# Patient Record
Sex: Female | Born: 1960
Health system: Southern US, Community
[De-identification: ages and names within clinical notes are randomized; demographics above are authoritative.]

## PROBLEM LIST (undated history)

## (undated) DIAGNOSIS — R102 Pelvic and perineal pain: Secondary | ICD-10-CM

## (undated) DIAGNOSIS — E785 Hyperlipidemia, unspecified: Secondary | ICD-10-CM

## (undated) DIAGNOSIS — Z9889 Other specified postprocedural states: Secondary | ICD-10-CM

## (undated) DIAGNOSIS — K219 Gastro-esophageal reflux disease without esophagitis: Secondary | ICD-10-CM

## (undated) DIAGNOSIS — L600 Ingrowing nail: Secondary | ICD-10-CM

## (undated) DIAGNOSIS — F419 Anxiety disorder, unspecified: Secondary | ICD-10-CM

## (undated) DIAGNOSIS — R7303 Prediabetes: Secondary | ICD-10-CM

## (undated) HISTORY — DX: Gastro-esophageal reflux disease without esophagitis: K21.9

## (undated) HISTORY — DX: Prediabetes: R73.03

## (undated) HISTORY — DX: Other specified postprocedural states: Z98.890

## (undated) HISTORY — PX: FOOT SURGERY: SHX648

## (undated) HISTORY — DX: Hyperlipidemia, unspecified: E78.5

## (undated) HISTORY — PX: ABDOMINAL HYSTERECTOMY: SHX81

## (undated) HISTORY — DX: Anxiety disorder, unspecified: F41.9

## (undated) HISTORY — DX: Pelvic and perineal pain: R10.2

## (undated) HISTORY — DX: Ingrowing nail: L60.0

---

## 2005-10-09 ENCOUNTER — Ambulatory Visit: Payer: Self-pay | Admitting: Obstetrics & Gynecology

## 2008-01-08 ENCOUNTER — Ambulatory Visit: Payer: Self-pay | Admitting: Obstetrics & Gynecology

## 2009-02-25 ENCOUNTER — Ambulatory Visit: Payer: Self-pay | Admitting: Obstetrics & Gynecology

## 2009-03-04 ENCOUNTER — Ambulatory Visit: Payer: Self-pay | Admitting: Obstetrics & Gynecology

## 2009-05-17 ENCOUNTER — Ambulatory Visit: Payer: Self-pay | Admitting: Unknown Physician Specialty

## 2009-05-18 ENCOUNTER — Ambulatory Visit: Payer: Self-pay | Admitting: Unknown Physician Specialty

## 2009-05-28 ENCOUNTER — Ambulatory Visit: Payer: Self-pay | Admitting: Unknown Physician Specialty

## 2010-06-06 ENCOUNTER — Ambulatory Visit: Payer: Self-pay | Admitting: Obstetrics & Gynecology

## 2014-08-03 ENCOUNTER — Ambulatory Visit: Payer: Self-pay | Admitting: Gastroenterology

## 2015-09-21 ENCOUNTER — Ambulatory Visit (INDEPENDENT_AMBULATORY_CARE_PROVIDER_SITE_OTHER): Payer: BLUE CROSS/BLUE SHIELD | Admitting: Podiatry

## 2015-09-21 ENCOUNTER — Encounter: Payer: Self-pay | Admitting: Podiatry

## 2015-09-21 VITALS — BP 134/87 | HR 73 | Resp 18

## 2015-09-21 DIAGNOSIS — L600 Ingrowing nail: Secondary | ICD-10-CM

## 2015-09-21 NOTE — Progress Notes (Signed)
   Subjective:    Patient ID: Sharon Carney, female    DOB: Jul 25, 1960, 55 y.o.   MRN: [id number]  HPI  55 year old female presents the also concerns of left big toe swelling or redness which is been ongoing for the last week or so. She has noticed the redness around the toenail the toenail has been somewhat sore along the nail border. She denies any drainage or pus. Denies any red streaks. She said no previous treatment. No recent injury or trauma. No other complaints.  Review of Systems  All other systems reviewed and are negative.      Objective:   Physical Exam General: AAO x3, NAD  Dermatological: There appears to be incurvation on the medial nail border left hallux toenail tenderness palpation overlying this area. There is localized edema and erythema along the nail border. There is no other areas of edema or erythema. No open lesions or pre-ulcer lesions identified.  Vascular: Dorsalis Pedis artery and Posterior Tibial artery pedal pulses are 2/4 bilateral with immedate capillary fill time. Pedal hair growth present. No varicosities and no lower extremity edema present bilateral. There is no pain with calf compression, swelling, warmth, erythema.   Neruologic: Grossly intact via light touch bilateral. Vibratory intact via tuning fork bilateral. Protective threshold with Semmes Wienstein monofilament intact to all pedal sites bilateral. Patellar and Achilles deep tendon reflexes 2+ bilateral. No Babinski or clonus noted bilateral.   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 There's tenderness on the medial nail border of the left hallux toenail. No other areas of tenderness to bilateral lower extremities.   Gait: Unassisted, Nonantalgic.      Assessment & Plan:  55 year old female left hallux medial nail border ingrown toenail -Treatment options discussed including all alternatives, risks, and complications -Etiology of symptoms were discussed -At this  time, the patient is requesting partial nail removal with chemical matricectomy to the symptomatic portion of the nail. Risks and complications were discussed with the patient for which they understand and  verbally consent to the procedure. Under sterile conditions a total of 3 mL of a mixture of 2% lidocaine plain and 0.5% Marcaine plain was infiltrated in a hallux block fashion. Once anesthetized, the skin was prepped in sterile fashion. A tourniquet was then applied. Next the medial aspect of hallux nail border was then sharply excised making sure to remove the entire offending nail border. Once the nails were ensured to be removed area was debrided and the underlying skin was intact. There is no purulence identified in the procedure. Next phenol was then applied under standard conditions and copiously irrigated. Silvadene was applied. A dry sterile dressing was applied. After application of the dressing the tourniquet was removed and there is found to be an immediate capillary refill time to the digit. The patient tolerated the procedure well any complications. Post procedure instructions were discussed the patient for which he verbally understood. Follow-up in one week for nail check or sooner if any problems are to arise. Discussed signs/symptoms of infection and directed to call the office immediately should any occur or go directly to the emergency room. In the meantime, encouraged to call the office with any questions, concerns, changes symptoms.  Ovid CurdMatthew Wagoner, DPM

## 2015-09-21 NOTE — Patient Instructions (Signed)

## 2015-09-28 ENCOUNTER — Ambulatory Visit (INDEPENDENT_AMBULATORY_CARE_PROVIDER_SITE_OTHER): Payer: BLUE CROSS/BLUE SHIELD | Admitting: Podiatry

## 2015-09-28 ENCOUNTER — Encounter: Payer: Self-pay | Admitting: Podiatry

## 2015-09-28 DIAGNOSIS — Z9889 Other specified postprocedural states: Secondary | ICD-10-CM | POA: Insufficient documentation

## 2015-09-28 DIAGNOSIS — L600 Ingrowing nail: Secondary | ICD-10-CM

## 2015-09-28 HISTORY — DX: Other specified postprocedural states: Z98.890

## 2015-09-28 HISTORY — DX: Ingrowing nail: L60.0

## 2015-09-28 NOTE — Progress Notes (Signed)
Patient ID: Sharon Carney Corradi, female   DOB: 1961/05/03, 55 y.o.   MRN: [id number]  Subjective: Sharon Carney Weigelt is a 55 y.o.  female returns to office today for follow up evaluation after having left medial Hallux permanent partial nail avulsion performed. Patient has been soaking using epsom satls and applying topical antibiotic covered with bandaid daily. She has occasional pain when walking. No drainage or pus. Patient denies fevers, chills, nausea, vomiting. Denies any calf pain, chest pain, SOB.   Objective:  Vitals: Reviewed  General: Well developed, nourished, in no acute distress, alert and oriented x3   Dermatology: Skin is warm, dry and supple bilateral. Left hallux nail border appears to be clean, dry, with mild granular tissue and surrounding scab. There is no surrounding erythema, edema, drainage/purulence. The remaining nails appear unremarkable at this time. There are no other lesions or other signs of infection present.  Neurovascular status: Intact. No lower extremity swelling; No pain with calf compression bilateral.  Musculoskeletal: Decreased tenderness to palpation of the medial hallux nail fold. Muscular strength within normal limits bilateral. HAV present.  Assesement and Plan: S/p partial nail avulsion, doing well.   -Continue soaking in epsom salts twice a day followed by antibiotic ointment and a band-aid. Can leave uncovered at night. Continue this until completely healed.  -If the area has not healed in 2 weeks, call the office for follow-up appointment, or sooner if any problems arise.  -Monitor for any signs/symptoms of infection. Call the office immediately if any occur or go directly to the emergency room. Call with any questions/concerns.  Ovid CurdMatthew Wagoner, DPM

## 2015-09-28 NOTE — Patient Instructions (Signed)

## 2016-01-06 DIAGNOSIS — Z01419 Encounter for gynecological examination (general) (routine) without abnormal findings: Secondary | ICD-10-CM | POA: Diagnosis not present

## 2016-01-06 DIAGNOSIS — N3281 Overactive bladder: Secondary | ICD-10-CM | POA: Diagnosis not present

## 2016-01-10 LAB — HM PAP SMEAR: HM PAP: NEGATIVE

## 2016-09-06 ENCOUNTER — Ambulatory Visit (INDEPENDENT_AMBULATORY_CARE_PROVIDER_SITE_OTHER): Payer: BLUE CROSS/BLUE SHIELD | Admitting: Podiatry

## 2016-09-06 ENCOUNTER — Encounter: Payer: Self-pay | Admitting: Podiatry

## 2016-09-06 DIAGNOSIS — M722 Plantar fascial fibromatosis: Secondary | ICD-10-CM | POA: Diagnosis not present

## 2016-09-06 DIAGNOSIS — B353 Tinea pedis: Secondary | ICD-10-CM | POA: Diagnosis not present

## 2016-09-06 MED ORDER — TRIAMCINOLONE ACETONIDE 0.1 % EX CREA
1.0000 | TOPICAL_CREAM | Freq: Two times a day (BID) | CUTANEOUS | 2 refills | Status: DC
Start: 2016-09-06 — End: 2019-04-21

## 2016-09-07 NOTE — Progress Notes (Signed)
She presents today with a chief complaint of a rash between the second and third toes of the left foot. She states that she's had athlete's foot before she's tried Naftin cream clips from is all I, as all powder and Lamisil cream to no avail. She states it is sore dry scaly plaque like and itches. She would also like a tear orthotics.  Objective: Vital signs are stable she is alert and oriented 3. Pulses are palpable. Neurologic sensorium is intact. Deep tendon reflexes are intact. Muscle strength +5 over 5 dorsiflexion plantar flexors and inverters everters all into the musculature is intact. Orthopedic evaluation and history is all joints distal to the ankle for range of motion without crepitus mild HAV deformities bilateral. Mild hammertoe deformity is flexible nature bilateral. Uncomplicated. Cutaneous evaluation does demonstrate soft tissue plaque over the proximal phalanx of the second digit. There is some dry scaly skin in this area but the plaque is hard appears to be more eczematous than tinea. Mild flexible foot deformity with tenderness on palpation medial calcaneal tubercles.  Assessment: Eczematous dermatitis. History of plantar fasciitis which appears to be recurring secondary to ulnar orthotics.  Plan: Start her on triamcinolone cream 0.1% applied twice daily for the first week to 2 weeks and then taper to once daily over the next week and then discontinue. Follow up with me if this does not resolve. She is candidate for nasal orthotics.

## 2016-10-09 ENCOUNTER — Ambulatory Visit (INDEPENDENT_AMBULATORY_CARE_PROVIDER_SITE_OTHER): Payer: BLUE CROSS/BLUE SHIELD | Admitting: Podiatry

## 2016-10-09 ENCOUNTER — Encounter: Payer: Self-pay | Admitting: Podiatry

## 2016-10-09 DIAGNOSIS — B353 Tinea pedis: Secondary | ICD-10-CM | POA: Diagnosis not present

## 2016-10-09 DIAGNOSIS — M722 Plantar fascial fibromatosis: Secondary | ICD-10-CM

## 2016-10-09 NOTE — Progress Notes (Signed)
She presents today to pick up her orthotics. She states that her toe is not itching like it was.  Objective: Vital signs are stable she is alert and oriented 3 she has dermatitis and second digit of the right foot. This appears to be much decrease in edema and erythema from previous visits.  Assessment: History of fasciitis capsulitis dermatitis.  Plan: A biopsy will be performed next visit if this does not resolve. Orthotics were never ordered and the patient states that she wants to cancel her order because she didn't know they were going across $1000.

## 2016-12-26 ENCOUNTER — Telehealth: Payer: Self-pay | Admitting: Obstetrics & Gynecology

## 2016-12-26 NOTE — Telephone Encounter (Signed)
Pt states breast in going across her breast down the sides and to her back. Went to exercise this morning and came back and rash appeared after her shower. Has not done anything different as in soaps or detergents. History of shingles but this rash is itchy and not painful. Per pt looks like a sunburn. Applied benadryl gel with little relief. Advised pt to take benadryl by mouth or use hydrocortisone cream OTC. If rash and itching persist advised to go to PCP or urgent care.

## 2016-12-26 NOTE — Telephone Encounter (Signed)
Pt is calling with a Rash going across her Breast. Would like to speak with a Nurse . Please advise no opening in schedule today

## 2016-12-29 DIAGNOSIS — B09 Unspecified viral infection characterized by skin and mucous membrane lesions: Secondary | ICD-10-CM | POA: Diagnosis not present

## 2017-04-01 ENCOUNTER — Other Ambulatory Visit: Payer: Self-pay | Admitting: Obstetrics & Gynecology

## 2017-04-30 ENCOUNTER — Encounter: Payer: Self-pay | Admitting: Obstetrics & Gynecology

## 2017-04-30 ENCOUNTER — Ambulatory Visit (INDEPENDENT_AMBULATORY_CARE_PROVIDER_SITE_OTHER): Payer: BLUE CROSS/BLUE SHIELD | Admitting: Obstetrics & Gynecology

## 2017-04-30 VITALS — BP 120/70 | HR 53 | Ht 64.0 in | Wt 160.0 lb

## 2017-04-30 DIAGNOSIS — Z1211 Encounter for screening for malignant neoplasm of colon: Secondary | ICD-10-CM | POA: Diagnosis not present

## 2017-04-30 DIAGNOSIS — Z13220 Encounter for screening for lipoid disorders: Secondary | ICD-10-CM | POA: Diagnosis not present

## 2017-04-30 DIAGNOSIS — Z1321 Encounter for screening for nutritional disorder: Secondary | ICD-10-CM

## 2017-04-30 DIAGNOSIS — Z1231 Encounter for screening mammogram for malignant neoplasm of breast: Secondary | ICD-10-CM | POA: Diagnosis not present

## 2017-04-30 DIAGNOSIS — Z1239 Encounter for other screening for malignant neoplasm of breast: Secondary | ICD-10-CM

## 2017-04-30 DIAGNOSIS — Z1329 Encounter for screening for other suspected endocrine disorder: Secondary | ICD-10-CM

## 2017-04-30 DIAGNOSIS — Z131 Encounter for screening for diabetes mellitus: Secondary | ICD-10-CM | POA: Diagnosis not present

## 2017-04-30 DIAGNOSIS — Z01419 Encounter for gynecological examination (general) (routine) without abnormal findings: Secondary | ICD-10-CM

## 2017-04-30 DIAGNOSIS — Z0000 Encounter for general adult medical examination without abnormal findings: Secondary | ICD-10-CM | POA: Insufficient documentation

## 2017-04-30 MED ORDER — TOLTERODINE TARTRATE ER 4 MG PO CP24: ORAL_CAPSULE | ORAL | 11 refills | Status: DC

## 2017-04-30 NOTE — Progress Notes (Signed)
HPI:      [name] is a 56 y.o. G2P2002 who LMP was in the past, she presents today for her annual examination.  The patient has no complaints today. The patient is not sexually active. Herlast pap: approximate date 2017 and was normal and last mammogram: approximate date 2016 and was normal.  The patient does perform self breast exams.  There is no notable family history of breast or ovarian cancer in her family. The patient is not taking hormone replacement therapy. Patient denies post-menopausal vaginal bleeding.   The patient has regular exercise: yes. The patient denies current symptoms of depression.  Meds for OAB help, has more sx's when misses doses.  Min dry mouth SE.  GYN Hx: Last Colonoscopy:1 year ago. Normal.  Last DEXA: never ago.    PMHx: History reviewed. No pertinent past medical history. Past Surgical History:  Procedure Laterality Date  . ABDOMINAL HYSTERECTOMY    . FOOT SURGERY     Family History  Problem Relation Age of Onset  . Lymphoma Father   . Skin cancer Brother    Social History   Tobacco Use  . Smoking status: Never Smoker  . Smokeless tobacco: Never Used  Substance Use Topics  . Alcohol use: No    Alcohol/week: 0.0 oz  . Drug use: No    Current Outpatient Medications:  .  tolterodine (DETROL LA) 4 MG 24 hr capsule, TAKE 1 CAPSULE BY ORAL ROUTE ONCE DAILY, Disp: 30 capsule, Rfl: 13 .  triamcinolone cream (KENALOG) 0.1 %, Apply 1 application topically 2 (two) times daily., Disp: 30 g, Rfl: 2 Allergies: Codeine  Review of Systems  Constitutional: Negative for chills, fever and malaise/fatigue.  HENT: Negative for congestion, sinus pain and sore throat.   Eyes: Negative for blurred vision and pain.  Respiratory: Negative for cough and wheezing.   Cardiovascular: Negative for chest pain and leg swelling.  Gastrointestinal: Negative for abdominal pain, constipation, diarrhea, heartburn, nausea and vomiting.  Genitourinary: Negative for  dysuria, frequency, hematuria and urgency.  Musculoskeletal: Negative for back pain, joint pain, myalgias and neck pain.  Skin: Negative for itching and rash.  Neurological: Negative for dizziness, tremors and weakness.  Endo/Heme/Allergies: Does not bruise/bleed easily.  Psychiatric/Behavioral: Negative for depression. The patient is not nervous/anxious and does not have insomnia.     Objective: BP 120/70   Pulse (!) 53   Ht 5\' 4"  (1.626 m)   Wt 160 lb (72.6 kg)   BMI 27.46 kg/m   Filed Weights   04/30/17 1328  Weight: 160 lb (72.6 kg)   Body mass index is 27.46 kg/m. Physical Exam  Constitutional: She is oriented to person, place, and time. She appears well-developed and well-nourished. No distress.  Genitourinary: Rectum normal and vagina normal. Pelvic exam was performed with patient supine. There is no rash or lesion on the right labia. There is no rash or lesion on the left labia. Vagina exhibits no lesion. No bleeding in the vagina. Right adnexum does not display mass and does not display tenderness. Left adnexum does not display mass and does not display tenderness.  Genitourinary Comments: Absent Uterus Absent cervix Vaginal cuff well healed  HENT:  Head: Normocephalic and atraumatic. Head is without laceration.  Right Ear: Hearing normal.  Left Ear: Hearing normal.  Nose: No epistaxis.  No foreign bodies.  Mouth/Throat: Uvula is midline, oropharynx is clear and moist and mucous membranes are normal.  Eyes: Pupils are equal, round, and reactive to light.  Neck: Normal range of motion. Neck supple. No thyromegaly present.  Cardiovascular: Normal rate and regular rhythm. Exam reveals no gallop and no friction rub.  No murmur heard. Pulmonary/Chest: Effort normal and breath sounds normal. No respiratory distress. She has no wheezes. Right breast exhibits no mass, no skin change and no tenderness. Left breast exhibits no mass, no skin change and no tenderness.  Abdominal:  Soft. Bowel sounds are normal. She exhibits no distension. There is no tenderness. There is no rebound.  Musculoskeletal: Normal range of motion.  Neurological: She is alert and oriented to person, place, and time. No cranial nerve deficit.  Skin: Skin is warm and dry.  Psychiatric: She has a normal mood and affect. Judgment normal.  Vitals reviewed.   Assessment: Annual Exam 1. Annual physical exam   2. Screening for breast cancer   3. Screen for colon cancer     Plan:            1.  Cervical Screening-  Pap smear schedule reviewed with patient  2. Breast screening- Exam annually and mammogram scheduled  3. Colonoscopy every 10 years, Hemoccult testing after age 56  4. Labs To return fasting at a later date  5. Counseling for hormonal therapy: none, no change in therapy today  6. Cont Detrol LAS as helps OAB sx's.     F/U  Return in about 1 year (around 04/30/2018) for Annual.  Annamarie MajorPaul Ares Cardozo, MD, Merlinda FrederickFACOG [facility], [facility] 04/30/2017  1:41 PM

## 2017-04-30 NOTE — Patient Instructions (Addendum)
PAP every three years Mammogram every year    Call [phone] to schedule at [place] Colonoscopy every 10 years Labs yearly 

## 2017-05-01 ENCOUNTER — Other Ambulatory Visit: Payer: BLUE CROSS/BLUE SHIELD

## 2017-05-01 DIAGNOSIS — Z1321 Encounter for screening for nutritional disorder: Secondary | ICD-10-CM

## 2017-05-01 DIAGNOSIS — Z131 Encounter for screening for diabetes mellitus: Secondary | ICD-10-CM

## 2017-05-01 DIAGNOSIS — Z13220 Encounter for screening for lipoid disorders: Secondary | ICD-10-CM | POA: Diagnosis not present

## 2017-05-01 DIAGNOSIS — Z1329 Encounter for screening for other suspected endocrine disorder: Secondary | ICD-10-CM | POA: Diagnosis not present

## 2017-05-02 LAB — LIPID PANEL
CHOLESTEROL TOTAL: 240 mg/dL — AB (ref 100–199)
Chol/HDL Ratio: 4.2 ratio (ref 0.0–4.4)
HDL: 57 mg/dL (ref >39)
LDL Calculated: 165 mg/dL — ABNORMAL HIGH (ref 0–99)
Triglycerides: 91 mg/dL (ref 0–149)
VLDL CHOLESTEROL CAL: 18 mg/dL (ref 5–40)

## 2017-05-02 LAB — HEMOGLOBIN A1C
ESTIMATED AVERAGE GLUCOSE: 111 mg/dL
HEMOGLOBIN A1C: 5.5 % (ref 4.8–5.6)

## 2017-05-02 LAB — TSH: TSH: 1.98 u[IU]/mL (ref 0.450–4.500)

## 2017-05-02 LAB — VITAMIN D 25 HYDROXY (VIT D DEFICIENCY, FRACTURES): VIT D 25 HYDROXY: 67.4 ng/mL (ref 30.0–100.0)

## 2017-05-04 DIAGNOSIS — Z1211 Encounter for screening for malignant neoplasm of colon: Secondary | ICD-10-CM | POA: Diagnosis not present

## 2017-05-07 LAB — FECAL OCCULT BLOOD, IMMUNOCHEMICAL: Fecal Occult Bld: NEGATIVE

## 2017-05-07 LAB — SPECIMEN STATUS REPORT

## 2017-05-25 ENCOUNTER — Ambulatory Visit
Admission: RE | Admit: 2017-05-25 | Discharge: 2017-05-25 | Disposition: A | Discharge status: Home / Self Care | Payer: BLUE CROSS/BLUE SHIELD | Source: Ambulatory Visit | Attending: Obstetrics & Gynecology | Admitting: Obstetrics & Gynecology

## 2017-05-25 ENCOUNTER — Encounter: Payer: Self-pay | Admitting: Radiology

## 2017-05-25 DIAGNOSIS — Z1239 Encounter for other screening for malignant neoplasm of breast: Secondary | ICD-10-CM

## 2017-05-25 DIAGNOSIS — Z1231 Encounter for screening mammogram for malignant neoplasm of breast: Secondary | ICD-10-CM | POA: Diagnosis not present

## 2017-05-29 ENCOUNTER — Other Ambulatory Visit: Payer: Self-pay | Admitting: *Deleted

## 2017-05-29 ENCOUNTER — Inpatient Hospital Stay
Admission: RE | Admit: 2017-05-29 | Discharge: 2017-05-29 | Disposition: A | Discharge status: Home / Self Care | Payer: Self-pay | Source: Ambulatory Visit | Attending: *Deleted | Admitting: *Deleted

## 2017-05-29 DIAGNOSIS — Z9289 Personal history of other medical treatment: Secondary | ICD-10-CM

## 2017-08-21 DIAGNOSIS — J324 Chronic pansinusitis: Secondary | ICD-10-CM | POA: Diagnosis not present

## 2017-08-21 DIAGNOSIS — J309 Allergic rhinitis, unspecified: Secondary | ICD-10-CM | POA: Diagnosis not present

## 2017-08-21 DIAGNOSIS — H6123 Impacted cerumen, bilateral: Secondary | ICD-10-CM | POA: Diagnosis not present

## 2017-08-21 DIAGNOSIS — Z0110 Encounter for examination of ears and hearing without abnormal findings: Secondary | ICD-10-CM | POA: Diagnosis not present

## 2017-08-21 DIAGNOSIS — H6980 Other specified disorders of Eustachian tube, unspecified ear: Secondary | ICD-10-CM | POA: Diagnosis not present

## 2017-08-21 DIAGNOSIS — R51 Headache: Secondary | ICD-10-CM | POA: Diagnosis not present

## 2017-08-23 DIAGNOSIS — J309 Allergic rhinitis, unspecified: Secondary | ICD-10-CM | POA: Diagnosis not present

## 2017-09-03 DIAGNOSIS — Z889 Allergy status to unspecified drugs, medicaments and biological substances status: Secondary | ICD-10-CM | POA: Diagnosis not present

## 2017-09-03 DIAGNOSIS — L500 Allergic urticaria: Secondary | ICD-10-CM | POA: Diagnosis not present

## 2017-10-06 DIAGNOSIS — H9209 Otalgia, unspecified ear: Secondary | ICD-10-CM | POA: Diagnosis not present

## 2017-10-06 DIAGNOSIS — J069 Acute upper respiratory infection, unspecified: Secondary | ICD-10-CM | POA: Diagnosis not present

## 2017-10-08 DIAGNOSIS — H6980 Other specified disorders of Eustachian tube, unspecified ear: Secondary | ICD-10-CM | POA: Diagnosis not present

## 2017-10-08 DIAGNOSIS — H66009 Acute suppurative otitis media without spontaneous rupture of ear drum, unspecified ear: Secondary | ICD-10-CM | POA: Diagnosis not present

## 2017-11-21 ENCOUNTER — Encounter: Payer: Self-pay | Admitting: Obstetrics & Gynecology

## 2017-11-21 ENCOUNTER — Ambulatory Visit (INDEPENDENT_AMBULATORY_CARE_PROVIDER_SITE_OTHER): Payer: BLUE CROSS/BLUE SHIELD | Admitting: Obstetrics & Gynecology

## 2017-11-21 VITALS — BP 120/80 | Ht 64.0 in | Wt 166.0 lb

## 2017-11-21 DIAGNOSIS — N390 Urinary tract infection, site not specified: Secondary | ICD-10-CM | POA: Insufficient documentation

## 2017-11-21 DIAGNOSIS — N3001 Acute cystitis with hematuria: Secondary | ICD-10-CM

## 2017-11-21 DIAGNOSIS — R350 Frequency of micturition: Secondary | ICD-10-CM

## 2017-11-21 LAB — POCT URINALYSIS DIPSTICK
BILIRUBIN UA: NEGATIVE
Blood, UA: POSITIVE
Glucose, UA: NEGATIVE
KETONES UA: NEGATIVE
NITRITE UA: NEGATIVE
PROTEIN UA: NEGATIVE
Spec Grav, UA: 1.02 (ref 1.010–1.025)
Urobilinogen, UA: 1 E.U./dL
pH, UA: 8 (ref 5.0–8.0)

## 2017-11-21 MED ORDER — SULFAMETHOXAZOLE-TRIMETHOPRIM 800-160 MG PO TABS: 1.0000 | ORAL_TABLET | Freq: Two times a day (BID) | ORAL | 0 refills | Status: DC

## 2017-11-21 NOTE — Patient Instructions (Signed)

## 2017-11-21 NOTE — Progress Notes (Signed)
ZOX:WRUEAVW, No Pcp Per Chief Complaint  Patient presents with  . Vaginal Pain  . Urinary Frequency   Current Issues:  Presents with 2 days of urinary frequency and vaginal pain Associated symptoms include:  lower abdominal pain  There is a previous history of of similar symptoms. Sexually active:  No   No concern for STI.  History reviewed. No pertinent past medical history. Past Surgical History:  Procedure Laterality Date  . ABDOMINAL HYSTERECTOMY    . FOOT SURGERY    The patient has a family history of no cancer or other related illnesses. She  reports that she has never smoked. She has never used smokeless tobacco. She reports that she does not drink alcohol or use drugs. Allergies  Allergen Reactions  . Clindamycin Hives  . Codeine    Prior to Admission medications   Medication Sig Start Date End Date Taking? Authorizing Provider  tolterodine (DETROL LA) 4 MG 24 hr capsule TAKE 1 CAPSULE BY ORAL ROUTE ONCE DAILY 04/30/17  Yes Nadara Mustard, MD  triamcinolone cream (KENALOG) 0.1 % Apply 1 application topically 2 (two) times daily. Patient not taking: Reported on 11/21/2017 09/06/16   Elinor Parkinson, DPM   Review of Systems:Recent hemorrhoid Review of Systems - General ROS: negative Psychological ROS: negative Allergy and Immunology ROS: recent URI Hematological and Lymphatic ROS: negative Endocrine ROS: negative Breast ROS: negative for breast lumps Respiratory ROS: no cough, shortness of breath, or wheezing Cardiovascular ROS: no chest pain or dyspnea on exertion Gastrointestinal ROS: no abdominal pain, change in bowel habits, or black or bloody stools recent hemorrhoid Genito-Urinary ROS: positive for - pelvic pain, urinary frequency/urgency and vulvar/vaginal symptoms Musculoskeletal ROS: negative Neurological ROS: no TIA or stroke symptoms Dermatological ROS: negative  PE:  BP 120/80   Ht  (1.626 m)   Wt 166 lb (75.3 kg)   BMI 28.49 kg/m  Constitutional  Gen NAD Heart Reg R/R, no m/g/r Lungs CTA B Back No CVAT Abdomen NT, ND, no mass Pelvic Absent uterus and cervix Vag atrophy No ext lesions No pelvic mass Rectal- very small hemorrhoid  Results for orders placed or performed in visit on 11/21/17  POCT urinalysis dipstick  Result Value Ref Range   Color, UA     Clarity, UA     Glucose, UA Negative Negative   Bilirubin, UA neg    Ketones, UA neg    Spec Grav, UA 1.020 1.010 - 1.025   Blood, UA positive    pH, UA 8.0 5.0 - 8.0   Protein, UA Negative Negative   Urobilinogen, UA 1.0 0.2 or 1.0 E.U./dL   Nitrite, UA neg    Leukocytes, UA Moderate (2+) (A) Negative   Appearance     Odor      Assessment and Plan:  1. Frequency of urination and UTI w Hematuria - POCT urinalysis dipstick - Treat w Bactrim  2. Hemorrhoid - Cont diet adjustments for IBS - OTC meds  Annamarie Major, MD, [facility], [facility] 11/21/2017  9:28 AM

## 2017-11-24 ENCOUNTER — Encounter: Payer: Self-pay | Admitting: Obstetrics & Gynecology

## 2017-11-26 ENCOUNTER — Encounter: Payer: Self-pay | Admitting: Obstetrics & Gynecology

## 2017-11-26 NOTE — Telephone Encounter (Signed)
Can you send in the previous rx?

## 2017-11-26 NOTE — Telephone Encounter (Signed)
Pt following up on previous email. Pt states she is still having trouble w/UTI. She was having problems with the medicine she was on. [phone]#[phone]

## 2017-11-26 NOTE — Telephone Encounter (Signed)
Not sure if you want her to come in for urine culture given continued symptoms and failure to improve despite finishing course of bactrim

## 2017-11-26 NOTE — Telephone Encounter (Signed)
[id number] said she needs more of the UTI meds [facility] gave her last week, she said she left a message this morning

## 2017-11-26 NOTE — Telephone Encounter (Signed)
Can you send in something different for her?

## 2017-11-27 ENCOUNTER — Other Ambulatory Visit: Payer: Self-pay | Admitting: Obstetrics & Gynecology

## 2017-11-27 MED ORDER — CIPROFLOXACIN HCL 500 MG PO TABS: 500.0000 mg | ORAL_TABLET | Freq: Two times a day (BID) | ORAL | 0 refills | Status: DC

## 2017-11-27 NOTE — Telephone Encounter (Signed)
Please see why this was delayed in response (phone message left on nurse line).

## 2017-11-27 NOTE — Telephone Encounter (Signed)
Pt aware and if symptoms not improving [facility] [place]

## 2017-11-29 ENCOUNTER — Encounter: Payer: Self-pay | Admitting: Obstetrics & Gynecology

## 2017-11-29 NOTE — Telephone Encounter (Signed)
[facility]

## 2017-11-30 ENCOUNTER — Encounter: Payer: Self-pay | Admitting: Obstetrics & Gynecology

## 2017-11-30 ENCOUNTER — Ambulatory Visit (INDEPENDENT_AMBULATORY_CARE_PROVIDER_SITE_OTHER): Payer: BLUE CROSS/BLUE SHIELD | Admitting: Obstetrics & Gynecology

## 2017-11-30 VITALS — BP 120/80 | Ht 64.0 in | Wt 165.0 lb

## 2017-11-30 DIAGNOSIS — R102 Pelvic and perineal pain: Secondary | ICD-10-CM

## 2017-11-30 DIAGNOSIS — R300 Dysuria: Secondary | ICD-10-CM | POA: Diagnosis not present

## 2017-11-30 HISTORY — DX: Pelvic and perineal pain: R10.2

## 2017-11-30 MED ORDER — ESTROGENS, CONJUGATED 0.625 MG/GM VA CREA: 1.0000 | TOPICAL_CREAM | VAGINAL | 3 refills | Status: DC

## 2017-11-30 NOTE — Patient Instructions (Signed)
Conjugated Estrogens vaginal cream What is this medicine? CONJUGATED ESTROGENS (CON ju gate ed ESS troe jenz) are a mixture of female hormones. This cream can help relieve symptoms associated with menopause.like vaginal dryness and irritation. This medicine may be used for other purposes; ask your health care provider or pharmacist if you have questions. COMMON BRAND NAME(S): Premarin What should I tell my health care provider before I take this medicine? They need to know if you have any of these conditions: -abnormal vaginal bleeding -blood vessel disease or blood clots -breast, cervical, endometrial, or uterine cancer -dementia -diabetes -gallbladder disease -heart disease or recent heart attack -high blood pressure -high cholesterol -high level of calcium in the blood -hysterectomy -kidney disease -liver disease -migraine headaches -protein C deficiency -protein S deficiency -stroke -systemic lupus erythematosus (SLE) -tobacco smoker -an unusual or allergic reaction to estrogens other medicines, foods, dyes, or preservatives -pregnant or trying to get pregnant -breast-feeding How should I use this medicine? This medicine is for use in the vagina only. Do not take by mouth. Follow the directions on the prescription label. Use at bedtime unless otherwise directed by your doctor or health care professional. Use the special applicator supplied with the cream. Wash hands before and after use. Fill the applicator with the cream and remove from the tube. Lie on your back, part and bend your knees. Insert the applicator into the vagina and push the plunger to expel the cream into the vagina. Wash the applicator with warm soapy water and rinse well. Use exactly as directed for the complete length of time prescribed. Do not stop using except on the advice of your doctor or health care professional. Talk to your pediatrician regarding the use of this medicine in children. Special care may be  needed. A patient package insert for the product will be given with each prescription and refill. Read this sheet carefully each time. The sheet may change frequently. Overdosage: If you think you have taken too much of this medicine contact a poison control center or emergency room at once. NOTE: This medicine is only for you. Do not share this medicine with others. What if I miss a dose? If you miss a dose, use it as soon as you can. If it is almost time for your next dose, use only that dose. Do not use double or extra doses. What may interact with this medicine? Do not take this medicine with any of the following medications: -aromatase inhibitors like aminoglutethimide, anastrozole, exemestane, letrozole, testolactone This medicine may also interact with the following medications: -barbiturates used for inducing sleep or treating seizures -carbamazepine -grapefruit juice -medicines for fungal infections like itraconazole and ketoconazole -raloxifene or tamoxifen -rifabutin -rifampin -rifapentine -ritonavir -some antibiotics used to treat infections -St. John's Wort -warfarin This list may not describe all possible interactions. Give your health care provider a list of all the medicines, herbs, non-prescription drugs, or dietary supplements you use. Also tell them if you smoke, drink alcohol, or use illegal drugs. Some items may interact with your medicine. What should I watch for while using this medicine? Visit your health care professional for regular checks on your progress. You will need a regular breast and pelvic exam. You should also discuss the need for regular mammograms with your health care professional, and follow his or her guidelines. This medicine can make your body retain fluid, making your fingers, hands, or ankles swell. Your blood pressure can go up. Contact your doctor or health care professional if you   feel you are retaining fluid. If you have any reason to think  you are pregnant; stop taking this medicine at once and contact your doctor or health care professional. Tobacco smoking increases the risk of getting a blood clot or having a stroke, especially if you are more than 57 years old. You are strongly advised not to smoke. If you wear contact lenses and notice visual changes, or if the lenses begin to feel uncomfortable, consult your eye care specialist. If you are going to have elective surgery, you may need to stop taking this medicine beforehand. Consult your health care professional for advice prior to scheduling the surgery. What side effects may I notice from receiving this medicine? Side effects that you should report to your doctor or health care professional as soon as possible: -allergic reactions like skin rash, itching or hives, swelling of the face, lips, or tongue -breast tissue changes or discharge -changes in vision -chest pain -confusion, trouble speaking or understanding -dark urine -general ill feeling or flu-like symptoms -light-colored stools -nausea, vomiting -pain, swelling, warmth in the leg -right upper belly pain -severe headaches -shortness of breath -sudden numbness or weakness of the face, arm or leg -trouble walking, dizziness, loss of balance or coordination -unusual vaginal bleeding -yellowing of the eyes or skin Side effects that usually do not require medical attention (report to your doctor or health care professional if they continue or are bothersome): -hair loss -increased hunger or thirst -increased urination -symptoms of vaginal infection like itching, irritation or unusual discharge -unusually weak or tired This list may not describe all possible side effects. Call your doctor for medical advice about side effects. You may report side effects to FDA at 1-800-FDA-1088. Where should I keep my medicine? Keep out of the reach of children. Store at room temperature between 15 and 30 degrees C (59 and 86  degrees F). Throw away any unused medicine after the expiration date. NOTE: This sheet is a summary. It may not cover all possible information. If you have questions about this medicine, talk to your doctor, pharmacist, or health care provider.  2018 Elsevier/Gold Standard (2010-09-14 09:20:36)  

## 2017-11-30 NOTE — Progress Notes (Signed)
  History of Present Illness:  Sharon Carney is a 57 y.o. who was started on  Bactrim first, then Cipro for UTI sx's approximately 1 weeks ago. Since that time, she states that her symptoms show no change.  PMHx: She  has no past medical history on file. Also,  has a past surgical history that includes Abdominal hysterectomy and Foot surgery., family history includes Lymphoma in her father; Skin cancer in her brother.,  reports that she has never smoked. She has never used smokeless tobacco. She reports that she does not drink alcohol or use drugs. No outpatient medications have been marked as taking for the 11/30/17 encounter (Office Visit) with Nadara MustardHarris, Peni Rupard P, MD.  . Also, is allergic to clindamycin and codeine..  Review of Systems  Constitutional: Negative for chills, fever and malaise/fatigue.  HENT: Negative for congestion, sinus pain and sore throat.   Eyes: Negative for blurred vision and pain.  Respiratory: Negative for cough and wheezing.   Cardiovascular: Negative for chest pain and leg swelling.  Gastrointestinal: Negative for abdominal pain, constipation, diarrhea, heartburn, nausea and vomiting.  Genitourinary: Negative for dysuria, frequency, hematuria and urgency.  Musculoskeletal: Negative for back pain, joint pain, myalgias and neck pain.  Skin: Negative for itching and rash.  Neurological: Negative for dizziness, tremors and weakness.  Endo/Heme/Allergies: Does not bruise/bleed easily.  Psychiatric/Behavioral: Negative for depression. The patient is not nervous/anxious and does not have insomnia.     Physical Exam:  BP 120/80   Ht 5\' 4"  (1.626 m)   Wt 165 lb (74.8 kg)   BMI 28.32 kg/m  Body mass index is 28.32 kg/m. Constitutional: Well nourished, well developed female in no acute distress.  Abdomen: diffusely non tender to palpation, non distended, and no masses, hernias Neuro: Grossly intact Psych:  Normal mood and affect.    Assessment:  Problem List Items  Addressed This Visit      Other   Vaginal pain - Primary    Other Visit Diagnoses    Dysuria         Medication treatment is going marginally for her UTI/dysuria/vaginal burning pain.  Plan: She will undergo no change in her medical therapy.  Finish Cipro. However, she will be started on Premarin Vaginal Cream for her vaginal atrophy to see if helps w burning sx's.  Daily x 1 week the 1-2 times weekly, if helps. If no help still, then consider urology eval for alternative etiology to her sx's.  She was amenable to this plan and we will see her back for annual/PRN.  A total of 15 minutes were spent face-to-face with the patient during this encounter and over half of that time dealt with counseling and coordination of care.  Annamarie MajorPaul Dquan Cortopassi, MD, Merlinda FrederickFACOG [facility], [facility] 11/30/2017  4:42 PM

## 2017-12-03 ENCOUNTER — Encounter: Payer: Self-pay | Admitting: Obstetrics & Gynecology

## 2017-12-03 NOTE — Telephone Encounter (Signed)
Please advise 

## 2017-12-04 ENCOUNTER — Encounter: Payer: Self-pay | Admitting: Obstetrics & Gynecology

## 2017-12-04 ENCOUNTER — Other Ambulatory Visit: Payer: Self-pay | Admitting: Obstetrics & Gynecology

## 2017-12-04 DIAGNOSIS — N390 Urinary tract infection, site not specified: Secondary | ICD-10-CM

## 2017-12-04 NOTE — Telephone Encounter (Signed)
Referral placed for Urology.

## 2017-12-23 NOTE — Progress Notes (Signed)
12/28/2017 10:06 AM   Ander Slade Marijo Conception 09-Nov-1960 [id number]  Referring provider: Nadara Mustard, MD [address] [place], [place] [address]  Chief Complaint  Patient presents with  . Recurrent UTI    New Patient    HPI: Patient is a 57 -year-old Caucasian female who is referred to [place] by Dr. Velora Mediate for recurrent urinary tract infections.  She states the UTI's started in May 2019.  Reviewing her records, she has had no documented positive urine cultures.     Her symptoms with a urinary tract infection consist of frequency, sore on the inside and pelvic pain that occurs after urination.  She has been on Bactrim and then Cipro.  She denies dysuria, gross hematuria, back pain, abdominal pain or flank pain associated with UTI's.  She has not had any recent fevers, chills, nausea or vomiting associated with UTI's.   Her baseline urinary symptoms consist of nocturia and urge incontinence.  She is wearing a panty liner daily.  Her UA today is positive for 21-50 squamous epithelial cells, 0-5 WBC's and rare bacteria.    She does not have a history of nephrolithiasis, GU surgery or GU trauma.   She is  sexually active.  She has not noted a correlation with her urinary tract infections and sexual intercourse.    She is postmenopausal and on vaginal estrogen cream.    She denies constipation and/or diarrhea.   She does engage in good perineal hygiene. She does not take tub baths.   She is drinking 80 ounces of water daily.   Occasional sweet tea and Dr. Reino Kent.  She had been on Detrol LA in the past.        PMH: Past Medical History:  Diagnosis Date  . Anxiety   . GERD (gastroesophageal reflux disease)   . Hyperlipemia   . Ingrown toenail 09/28/2015  . Pre-diabetes   . S/P nail surgery 09/28/2015  . Vaginal pain 11/30/2017    Surgical History: Past Surgical History:  Procedure Laterality Date  . ABDOMINAL HYSTERECTOMY    . FOOT SURGERY      Home Medications:    Allergies as of 12/28/2017      Reactions   Clindamycin Hives   Codeine       Medication List        Accurate as of 12/28/17 10:06 AM. Always use your most recent med list.          conjugated estrogens vaginal cream Commonly known as:  PREMARIN Place 1 Applicatorful vaginally 2 (two) times a week. 1 gram vaginally at bedtime twice weekly   tolterodine 4 MG 24 hr capsule Commonly known as:  DETROL LA TAKE 1 CAPSULE BY ORAL ROUTE ONCE DAILY   triamcinolone cream 0.1 % Commonly known as:  KENALOG Apply 1 application topically 2 (two) times daily.       Allergies:  Allergies  Allergen Reactions  . Clindamycin Hives  . Codeine     Family History: Family History  Problem Relation Age of Onset  . Lymphoma Father   . Skin cancer Brother     Social History:  reports that she has never smoked. She has never used smokeless tobacco. She reports that she does not drink alcohol or use drugs.  ROS: UROLOGY Frequent Urination?: Yes Hard to postpone urination?: Yes Burning/pain with urination?: No Get up at night to urinate?: Yes Leakage of urine?: Yes Urine stream starts and stops?: No Trouble starting stream?: No Do you have  to strain to urinate?: No Blood in urine?: No Urinary tract infection?: Yes Sexually transmitted disease?: No Injury to kidneys or bladder?: No Painful intercourse?: Yes Weak stream?: No Currently pregnant?: No Vaginal bleeding?: No Last menstrual period?: n  Gastrointestinal Nausea?: Yes Vomiting?: No Indigestion/heartburn?: No Diarrhea?: No Constipation?: No  Constitutional Fever: No Night sweats?: Yes Weight loss?: No Fatigue?: No  Skin Skin rash/lesions?: No Itching?: No  Eyes Blurred vision?: No Double vision?: No  Ears/Nose/Throat Sore throat?: No Sinus problems?: No  Hematologic/Lymphatic Swollen glands?: No Easy bruising?: No  Cardiovascular Leg swelling?: No Chest pain?: No  Respiratory Cough?:  No Shortness of breath?: No  Endocrine Excessive thirst?: No  Musculoskeletal Back pain?: No Joint pain?: No  Neurological Headaches?: No Dizziness?: No  Psychologic Depression?: No Anxiety?: No  Physical Exam: BP (!) 149/91   Pulse 82   Ht 5\' 6"  (1.676 m)   Wt 160 lb (72.6 kg)   BMI 25.82 kg/m   Constitutional:  Well nourished. Alert and oriented, No acute distress. HEENT: [place] AT, moist mucus membranes.  Trachea midline, no masses. Cardiovascular: No clubbing, cyanosis, or edema. Respiratory: Normal respiratory effort, no increased work of breathing. GI: Abdomen is soft, non tender, non distended, no abdominal masses. Liver and spleen not palpable.  No hernias appreciated.  Stool sample for occult testing is not indicated.   GU: No CVA tenderness.  No bladder fullness or masses.  Atrophic external genitalia, normal pubic hair distribution, no lesions.  Normal urethral meatus, no lesions, no prolapse, no discharge.   No urethral masses, tenderness and/or tenderness. No bladder fullness, tenderness or masses. Pale vagina mucosa, poor estrogen effect, no discharge, no lesions, good pelvic support, Grade I cystocele.  No rectocele noted.  No cervical motion tenderness.  Uterus is surgically absent.  Right adnexal tenderness is noted.  Anus and perineum are without rashes or lesions.    Skin: No rashes, bruises or suspicious lesions. Lymph: No cervical or inguinal adenopathy. Neurologic: Grossly intact, no focal deficits, moving all 4 extremities. Psychiatric: Normal mood and affect.  Laboratory Data: No results found for: WBC, HGB, HCT, MCV, PLT  Lab Results  Component Value Date   CREATININE 0.99 12/28/2017    No results found for: PSA  No results found for: TESTOSTERONE  Lab Results  Component Value Date   HGBA1C 5.5 05/01/2017    Lab Results  Component Value Date   TSH 1.980 05/01/2017       Component Value Date/Time   CHOL 240 (H) 05/01/2017 0859   HDL 57  05/01/2017 0859   CHOLHDL 4.2 05/01/2017 0859   LDLCALC 165 (H) 05/01/2017 0859    No results found for: AST No results found for: ALT No components found for: ALKALINEPHOPHATASE No components found for: BILIRUBINTOTAL  No results found for: ESTRADIOL  Urinalysis See HPI and Epic.  I have reviewed the labs.   Pertinent Imaging: No previous imaging  Assessment & Plan:    1. Right adnexal tenderness Schedule pelvic ultrasound Instructed to not use the vaginal estrogen cream at this time in case there is an ovarian cancer RTC for report - if NED will pursue cystoscopy  2. Frequency Patient to restart her Detrol  UA is sent for culture  Will pursue cystoscopy if pelvic ultrasound is negative and symptoms do not abate with the Detrol  3. Vaginal atrophy Hold on vaginal estrogen cream until pelvic ultrasound completed  Return for pelvic ultrasound results .  These notes generated with voice recognition software. I apologize for typographical errors.  Michiel Cowboy, PA-C  [facility] [address]  [address] [place], [place] [address] [phone]

## 2017-12-28 ENCOUNTER — Other Ambulatory Visit
Admission: RE | Admit: 2017-12-28 | Discharge: 2017-12-28 | Disposition: A | Discharge status: Home / Self Care | Payer: BLUE CROSS/BLUE SHIELD | Source: Ambulatory Visit | Attending: Urology | Admitting: Urology

## 2017-12-28 ENCOUNTER — Ambulatory Visit (INDEPENDENT_AMBULATORY_CARE_PROVIDER_SITE_OTHER): Payer: BLUE CROSS/BLUE SHIELD | Admitting: Urology

## 2017-12-28 ENCOUNTER — Encounter: Payer: Self-pay | Admitting: Urology

## 2017-12-28 ENCOUNTER — Other Ambulatory Visit: Payer: Self-pay

## 2017-12-28 VITALS — BP 149/91 | HR 82 | Ht 66.0 in | Wt 160.0 lb

## 2017-12-28 DIAGNOSIS — N390 Urinary tract infection, site not specified: Secondary | ICD-10-CM | POA: Diagnosis not present

## 2017-12-28 DIAGNOSIS — R350 Frequency of micturition: Secondary | ICD-10-CM

## 2017-12-28 DIAGNOSIS — R1021 Pelvic and perineal pain right side: Secondary | ICD-10-CM

## 2017-12-28 DIAGNOSIS — N952 Postmenopausal atrophic vaginitis: Secondary | ICD-10-CM | POA: Diagnosis not present

## 2017-12-28 DIAGNOSIS — R102 Pelvic and perineal pain: Secondary | ICD-10-CM

## 2017-12-28 LAB — URINALYSIS, COMPLETE (UACMP) WITH MICROSCOPIC
BILIRUBIN URINE: NEGATIVE
GLUCOSE, UA: NEGATIVE mg/dL
Hgb urine dipstick: NEGATIVE
KETONES UR: NEGATIVE mg/dL
Leukocytes, UA: NEGATIVE
NITRITE: NEGATIVE
PH: 7 (ref 5.0–8.0)
Protein, ur: NEGATIVE mg/dL
RBC / HPF: NONE SEEN RBC/hpf (ref 0–5)
SPECIFIC GRAVITY, URINE: 1.01 (ref 1.005–1.030)

## 2017-12-28 LAB — BLADDER SCAN AMB NON-IMAGING

## 2017-12-28 LAB — CREATININE, SERUM
CREATININE: 0.99 mg/dL (ref 0.44–1.00)
GFR calc Af Amer: >60 mL/min (ref >60)
GFR calc non Af Amer: >60 mL/min (ref >60)

## 2017-12-28 LAB — BUN: BUN: 19 mg/dL (ref 6–20)

## 2017-12-30 LAB — URINE CULTURE: Culture: >=100000 — AB

## 2018-01-01 ENCOUNTER — Encounter: Payer: Self-pay | Admitting: Urology

## 2018-01-01 ENCOUNTER — Telehealth: Payer: Self-pay

## 2018-01-01 MED ORDER — NITROFURANTOIN MONOHYD MACRO 100 MG PO CAPS: 100.0000 mg | ORAL_CAPSULE | Freq: Two times a day (BID) | ORAL | 0 refills | Status: DC

## 2018-01-01 NOTE — Telephone Encounter (Signed)
Urine culture positive sensitive to Macrobid

## 2018-01-07 ENCOUNTER — Encounter: Payer: Self-pay | Admitting: Urology

## 2018-01-07 ENCOUNTER — Encounter (INDEPENDENT_AMBULATORY_CARE_PROVIDER_SITE_OTHER): Payer: Self-pay

## 2018-01-09 ENCOUNTER — Ambulatory Visit
Admission: RE | Admit: 2018-01-09 | Discharge: 2018-01-09 | Disposition: A | Discharge status: Home / Self Care | Payer: BLUE CROSS/BLUE SHIELD | Source: Ambulatory Visit | Attending: Urology | Admitting: Urology

## 2018-01-09 DIAGNOSIS — Z90710 Acquired absence of both cervix and uterus: Secondary | ICD-10-CM | POA: Diagnosis not present

## 2018-01-09 DIAGNOSIS — R102 Pelvic and perineal pain: Secondary | ICD-10-CM

## 2018-01-17 NOTE — Progress Notes (Signed)
01/18/2018 10:08 AM   Sharon Carney 1961-02-10 [id number]  Referring provider: No referring provider defined for this encounter.  Chief Complaint  Patient presents with  . Follow-up    HPI: Patient is a 57 year old Caucasian female with a history of frequency and vaginal atrophy who presents today to discuss pelvic ultrasound results.  Background history Patient is a 59 -year-old Caucasian female who is referred to [place] by Dr. Velora Mediate for recurrent urinary tract infections.  She states the UTI's started in May 2019.  Reviewing her records, she has had no documented positive urine cultures.  Her symptoms with a urinary tract infection consist of frequency, sore on the inside and pelvic pain that occurs after urination.  She has been on Bactrim and then Cipro.  She denies dysuria, gross hematuria, back pain, abdominal pain or flank pain associated with UTI's.  She has not had any recent fevers, chills, nausea or vomiting associated with UTI's.   Her baseline urinary symptoms consist of nocturia and urge incontinence.  She is wearing a panty liner daily.  Her UA today is positive for 21-50 squamous epithelial cells, 0-5 WBC's and rare bacteria.  She does not have a history of nephrolithiasis, GU surgery or GU trauma.   She is  sexually active.  She has not noted a correlation with her urinary tract infections and sexual intercourse.   She is postmenopausal and on vaginal estrogen cream.  She denies constipation and/or diarrhea.   She does engage in good perineal hygiene. She does not take tub baths.   She is drinking 80 ounces of water daily.   Occasional sweet tea and Dr. Reino Kent.  She had been on Detrol LA in the past.    Urine culture from 12/28/2017 was positive for pan sensitive enterococcus faecalis.    Pelvic ultrasound on 01/09/2018 noted normal right ovary. No adnexal mass or other acute abnormality within pelvis.  Nonvisualization of the left ovary.  Status post hysterectomy.     Today, the patient has been experiencing urgency x 4-7, frequency x 8 or more, is restricting fluids to avoid visits to the restroom, is engaging in toilet mapping, incontinence x 0-3 and nocturia x 0-3.   Patient denies any gross hematuria, dysuria or suprapubic/flank pain.  Patient denies any fevers, chills, nausea or vomiting.   Her main complaint today is frequency, nocturia and hesitancy.  Her PVR is 15 mL.       PMH: Past Medical History:  Diagnosis Date  . Anxiety   . GERD (gastroesophageal reflux disease)   . Hyperlipemia   . Ingrown toenail 09/28/2015  . Pre-diabetes   . S/P nail surgery 09/28/2015  . Vaginal pain 11/30/2017    Surgical History: Past Surgical History:  Procedure Laterality Date  . ABDOMINAL HYSTERECTOMY    . FOOT SURGERY      Home Medications:  Allergies as of 01/18/2018      Reactions   Clindamycin Hives   Codeine       Medication List        Accurate as of 01/18/18 10:08 AM. Always use your most recent med list.          conjugated estrogens vaginal cream Commonly known as:  PREMARIN Place 1 Applicatorful vaginally 2 (two) times a week. 1 gram vaginally at bedtime twice weekly   nitrofurantoin (macrocrystal-monohydrate) 100 MG capsule Commonly known as:  MACROBID Take 1 capsule (100 mg total) by mouth every 12 (twelve) hours.  tolterodine 4 MG 24 hr capsule Commonly known as:  DETROL LA TAKE 1 CAPSULE BY ORAL ROUTE ONCE DAILY   triamcinolone cream 0.1 % Commonly known as:  KENALOG Apply 1 application topically 2 (two) times daily.       Allergies:  Allergies  Allergen Reactions  . Clindamycin Hives  . Codeine     Family History: Family History  Problem Relation Age of Onset  . Lymphoma Father   . Skin cancer Brother     Social History:  reports that she has never smoked. She has never used smokeless tobacco. She reports that she does not drink alcohol or use drugs.  ROS: UROLOGY Frequent Urination?: Yes Hard to  postpone urination?: No Burning/pain with urination?: No Get up at night to urinate?: Yes Leakage of urine?: No Urine stream starts and stops?: No Trouble starting stream?: Yes Do you have to strain to urinate?: No Blood in urine?: No Urinary tract infection?: No Sexually transmitted disease?: No Injury to kidneys or bladder?: No Painful intercourse?: No Weak stream?: No Currently pregnant?: No Vaginal bleeding?: No Last menstrual period?: n  Gastrointestinal Nausea?: No Vomiting?: No Indigestion/heartburn?: No Diarrhea?: No Constipation?: No  Constitutional Fever: No Night sweats?: No Weight loss?: No Fatigue?: No  Skin Skin rash/lesions?: No Itching?: No  Eyes Blurred vision?: No Double vision?: No  Ears/Nose/Throat Sore throat?: No Sinus problems?: No  Hematologic/Lymphatic Swollen glands?: No Easy bruising?: No  Cardiovascular Leg swelling?: No Chest pain?: No  Respiratory Cough?: No Shortness of breath?: No  Endocrine Excessive thirst?: No  Musculoskeletal Back pain?: No Joint pain?: No  Neurological Headaches?: No Dizziness?: No  Psychologic Depression?: No Anxiety?: No  Physical Exam: BP 117/76 (BP Location: Left Arm, Patient Position: Sitting, Cuff Size: Normal)   Pulse 66   Ht 5\' 6"  (1.676 m)   Wt 160 lb (72.6 kg)   BMI 25.82 kg/m   Constitutional: Well nourished. Alert and oriented, No acute distress. HEENT: [place] AT, moist mucus membranes. Trachea midline, no masses. Cardiovascular: No clubbing, cyanosis, or edema. Respiratory: Normal respiratory effort, no increased work of breathing. Skin: No rashes, bruises or suspicious lesions. Lymph: No cervical or inguinal adenopathy. Neurologic: Grossly intact, no focal deficits, moving all 4 extremities. Psychiatric: Normal mood and affect.   Laboratory Data: No results found for: WBC, HGB, HCT, MCV, PLT  Lab Results  Component Value Date   CREATININE 0.99 12/28/2017     No results found for: PSA  No results found for: TESTOSTERONE  Lab Results  Component Value Date   HGBA1C 5.5 05/01/2017    Lab Results  Component Value Date   TSH 1.980 05/01/2017       Component Value Date/Time   CHOL 240 (H) 05/01/2017 0859   HDL 57 05/01/2017 0859   CHOLHDL 4.2 05/01/2017 0859   LDLCALC 165 (H) 05/01/2017 0859    No results found for: AST No results found for: ALT No components found for: ALKALINEPHOPHATASE No components found for: BILIRUBINTOTAL  No results found for: ESTRADIOL  I have reviewed the labs.   Pertinent Imaging: CLINICAL DATA:  Initial evaluation right adnexal tenderness and mid pelvic pain. History of prior hysterectomy.  EXAM: TRANSABDOMINAL AND TRANSVAGINAL ULTRASOUND OF PELVIS  TECHNIQUE: Both transabdominal and transvaginal ultrasound examinations of the pelvis were performed. Transabdominal technique was performed for global imaging of the pelvis including uterus, ovaries, adnexal regions, and pelvic cul-de-sac. It was necessary to proceed with endovaginal exam following the transabdominal exam to visualize the pelvic  structures.  COMPARISON:  None  FINDINGS: Uterus  Surgically absent.  No abnormality at the vaginal cuff.  Endometrium  Surgically absent.  Right ovary  Measurements: 1.1 x 2.0 x 0.9 cm. Normal appearance/no adnexal mass.  Left ovary  Not visualized.  No adnexal mass.  Other findings  No abnormal free fluid.  IMPRESSION: 1. Normal right ovary. No adnexal mass or other acute abnormality within pelvis. 2. Nonvisualization of the left ovary. 3. Status post hysterectomy.  This   Electronically Signed   By: Rise MuBenjamin  McClintock M.D.   On: 01/09/2018 13:14  Results for Sharon DickBRASWELL, Lynlee (MRN [id number]) as of 01/18/2018 09:52  Ref. Range 01/18/2018 09:31  Scan Result Unknown 15    I have independently reviewed the films  Assessment & Plan:    1. Right adnexal  tenderness No etiology for tenderness seen on pelvic ultrasound  2. Frequency Some improvement with Detrol, but not at goal Will change to Myrbetriq 50 mg, # 28 samples; I have advised the patient of the side effects of Myrbetriq, such as: elevation in BP, urinary retention and/or HA. Discuss referral to PT, patient will think about it RTC in 3 weeks for OAB questionnaire and PVR   3. Vaginal atrophy I explained to the patient that when women go through menopause and her estrogen levels are severely diminished, the normal vaginal flora will change.  This is due to an increase of the vaginal canal's pH. Because of this, the vaginal canal may be colonized by bacteria from the rectum instead of the protective lactobacillus.  This, accompanied by the loss of the mucus barrier with vaginal atrophy, is a cause of recurrent urinary tract infections. In some studies, the use of vaginal estrogen cream has been demonstrated to reduce  recurrent urinary tract infections to one a year.  Patient was given a sample of vaginal estrogen cream (Premarin vaginal cream) and instructed to apply 0.5mg  (pea-sized amount)  just inside the vaginal introitus with a finger-tip on Monday, Wednesday and Friday nights.  I explained to the patient that vaginally administered estrogen, which causes only a slight increase in the blood estrogen levels, have fewer contraindications and adverse systemic effects that oral HT. I have also given prescriptions for the Estrace cream and Premarin cream, so that the patient may carry them to the pharmacy to see which one of the branded creams would be most economical for her.  If she finds both medications cost prohibitive, she is instructed to call the office.  We can then call in a compounded vaginal estrogen cream for the patient that may be more affordable.   She will follow up in three months for an exam.    4. Pelvic pain Will think about PT referral                                 Return in about 3 weeks (around 02/08/2018) for PVR and OAB questionnaire.  These notes generated with voice recognition software. I apologize for typographical errors.  Michiel CowboySHANNON Eldena Dede, PA-C  [facility] [address]  [address] [place], [place] [address] [phone]

## 2018-01-18 ENCOUNTER — Encounter: Payer: Self-pay | Admitting: Urology

## 2018-01-18 ENCOUNTER — Ambulatory Visit (INDEPENDENT_AMBULATORY_CARE_PROVIDER_SITE_OTHER): Payer: BLUE CROSS/BLUE SHIELD | Admitting: Urology

## 2018-01-18 VITALS — BP 117/76 | HR 66 | Ht 66.0 in | Wt 160.0 lb

## 2018-01-18 DIAGNOSIS — N952 Postmenopausal atrophic vaginitis: Secondary | ICD-10-CM | POA: Diagnosis not present

## 2018-01-18 DIAGNOSIS — R102 Pelvic and perineal pain: Secondary | ICD-10-CM

## 2018-01-18 DIAGNOSIS — R350 Frequency of micturition: Secondary | ICD-10-CM

## 2018-01-18 LAB — BLADDER SCAN AMB NON-IMAGING: SCAN RESULT: 15

## 2018-01-18 MED ORDER — ESTROGENS, CONJUGATED 0.625 MG/GM VA CREA: TOPICAL_CREAM | VAGINAL | 12 refills | Status: DC

## 2018-01-18 MED ORDER — ESTRADIOL 0.1 MG/GM VA CREA: TOPICAL_CREAM | VAGINAL | 12 refills | Status: DC

## 2018-01-18 NOTE — Patient Instructions (Signed)
I have given you two prescriptions for a vaginal estrogen cream.  Estrace and Premarin.  Please take these to your pharmacy and see which one your insurance covers.  If both are too expensive, please call the office at [phone] for an alternative.  You are given a sample of vaginal estrogen cream Premarin and instructed to apply 0.5mg (pea-sized amount)  just inside the vaginal introitus with a finger-tip on Monday, Wednesday and Friday nights,     

## 2018-01-21 ENCOUNTER — Encounter: Payer: Self-pay | Admitting: Urology

## 2018-01-21 ENCOUNTER — Telehealth: Payer: Self-pay | Admitting: Urology

## 2018-01-21 NOTE — Telephone Encounter (Signed)
Please put patient on the nurse schedule for a PVR on 01/22/2018.

## 2018-01-22 NOTE — Telephone Encounter (Signed)
Pt called back to let you she is not feeling well again.  She has NOT taken her Myrbetriq today.  Please give pt a call.

## 2018-01-22 NOTE — Telephone Encounter (Signed)
Is she still taking the Myrbetriq?

## 2018-01-22 NOTE — Telephone Encounter (Signed)
Pt had been taking it, but she is stopping today to see how she does while off of it.

## 2018-01-22 NOTE — Telephone Encounter (Signed)
Pt sent mychart message stating, "I'm hurting more this afternoon. I called the office and they were going to pass the message on to [place]. Is there anything I can do in the meantime?"  Please advise

## 2018-01-22 NOTE — Telephone Encounter (Signed)
Patient notified and will call in AM if she does not have to work to schedule nurse visit for UA, PVR

## 2018-01-22 NOTE — Telephone Encounter (Signed)
Sharon Carney will need to come in tomorrow for a PVR and a UA to check for infection.

## 2018-01-22 NOTE — Telephone Encounter (Signed)
Patient said she was doing better and declined the appointment. Said she would call back if things changed.   Sharon DusterMichelle

## 2018-01-23 ENCOUNTER — Ambulatory Visit: Payer: BLUE CROSS/BLUE SHIELD

## 2018-01-23 ENCOUNTER — Other Ambulatory Visit: Payer: Self-pay

## 2018-01-23 VITALS — BP 130/79 | HR 91 | Ht 64.0 in | Wt 160.0 lb

## 2018-01-23 DIAGNOSIS — N390 Urinary tract infection, site not specified: Secondary | ICD-10-CM | POA: Diagnosis not present

## 2018-01-23 LAB — URINALYSIS, COMPLETE
BILIRUBIN UA: NEGATIVE
Glucose, UA: NEGATIVE
Ketones, UA: NEGATIVE
Leukocytes, UA: NEGATIVE
NITRITE UA: NEGATIVE
Protein, UA: NEGATIVE
RBC UA: NEGATIVE
SPEC GRAV UA: 1.015 (ref 1.005–1.030)
UUROB: 0.2 mg/dL (ref 0.2–1.0)
pH, UA: 7 (ref 5.0–7.5)

## 2018-01-23 NOTE — Progress Notes (Signed)
Pt presents in office today for nurse visit. She is complaining of dysuria, frequency, urgency, and lower abdominal pain. Urine was collected for analysis and culture. Pt informed we would call with results.

## 2018-01-26 LAB — CULTURE, URINE COMPREHENSIVE

## 2018-01-28 ENCOUNTER — Encounter: Payer: Self-pay | Admitting: Urology

## 2018-01-28 ENCOUNTER — Telehealth: Payer: Self-pay | Admitting: Urology

## 2018-01-28 NOTE — Telephone Encounter (Signed)
Mrs. Sharon Carney needs to be scheduled for a cystoscopy to evaluate for persistent dysuria and urgency with a negative culture.

## 2018-01-29 NOTE — Telephone Encounter (Signed)
Spoke with patient and she has been scheduled for Monday the 12th  [facility]

## 2018-02-04 ENCOUNTER — Encounter: Payer: Self-pay | Admitting: Urology

## 2018-02-04 ENCOUNTER — Ambulatory Visit: Payer: BLUE CROSS/BLUE SHIELD | Admitting: Urology

## 2018-02-04 VITALS — BP 128/81 | HR 74 | Ht 64.0 in | Wt 164.3 lb

## 2018-02-04 DIAGNOSIS — R319 Hematuria, unspecified: Secondary | ICD-10-CM | POA: Diagnosis not present

## 2018-02-04 DIAGNOSIS — N390 Urinary tract infection, site not specified: Secondary | ICD-10-CM

## 2018-02-04 DIAGNOSIS — R300 Dysuria: Secondary | ICD-10-CM

## 2018-02-04 LAB — URINALYSIS, COMPLETE
Bilirubin, UA: NEGATIVE
Glucose, UA: NEGATIVE
Ketones, UA: NEGATIVE
Leukocytes, UA: NEGATIVE
NITRITE UA: NEGATIVE
Protein, UA: NEGATIVE
RBC, UA: NEGATIVE
Specific Gravity, UA: 1.02 (ref 1.005–1.030)
UUROB: 0.2 mg/dL (ref 0.2–1.0)
pH, UA: 7 (ref 5.0–7.5)

## 2018-02-04 LAB — MICROSCOPIC EXAMINATION: RBC, UA: NONE SEEN /hpf (ref 0–2)

## 2018-02-04 MED ORDER — CIPROFLOXACIN HCL 500 MG PO TABS: 500.0000 mg | ORAL_TABLET | Freq: Once | ORAL | Status: DC

## 2018-02-04 MED ORDER — LIDOCAINE HCL URETHRAL/MUCOSAL 2 % EX GEL
1.0000 "application " | Freq: Once | CUTANEOUS | Status: AC
  Administered 2018-02-04: 1 via URETHRAL

## 2018-02-04 MED ORDER — SULFAMETHOXAZOLE-TRIMETHOPRIM 400-80 MG PO TABS
1.0000 | ORAL_TABLET | Freq: Once | ORAL | Status: AC
  Administered 2018-02-04: 1 via ORAL

## 2018-02-04 NOTE — Progress Notes (Signed)
Cystoscopy Procedure Note:  Indication: [name] is a 57 yo F with history of dysuria and frequency since May 2019 despite negative cultures (Enterococcus x1 12/28/2017, culture 01/23/2018 negative). She is followed by our PA Sharon Carney. Additional workup has included negative pelvic ultrasound.   After informed consent and discussion of the procedure and its risks, Sharon Carney was positioned and prepped in the standard fashion. Cystoscopy was performed with the a flexible cystoscope. The urethra, bladder neck and entire bladder was visualized in a standard fashion. The mucosa was grossly normal throughout the bladder. Retroflexion did not demonstrate any lesions at the bladder neck. The ureteral orifices were visualized in their normal location and orientation. Wash cytology sent.  Findings: Normal cystoscopy  Assessment and Plan: Keep scheduled follow up with Sharon CowboyShannon McGowan, PA   Sharon RamsBrian Arna Luis, MD

## 2018-02-04 NOTE — Patient Instructions (Signed)
Keep scheduled follow up with Michiel CowboyShannon McGowan, will call with cytology results

## 2018-02-07 ENCOUNTER — Other Ambulatory Visit: Payer: Self-pay | Admitting: Urology

## 2018-02-08 ENCOUNTER — Ambulatory Visit: Payer: BLUE CROSS/BLUE SHIELD | Admitting: Urology

## 2018-03-26 DIAGNOSIS — H6980 Other specified disorders of Eustachian tube, unspecified ear: Secondary | ICD-10-CM | POA: Diagnosis not present

## 2018-03-26 DIAGNOSIS — H6123 Impacted cerumen, bilateral: Secondary | ICD-10-CM | POA: Diagnosis not present

## 2018-11-09 IMAGING — MG MM DIGITAL SCREENING BILAT W/ CAD
4 series · 4 of 4 positions shown · non-contrast
Comparison: Previous exam(s).

CLINICAL DATA: Screening.

EXAM:
DIGITAL SCREENING BILATERAL MAMMOGRAM WITH CAD

[L CC]
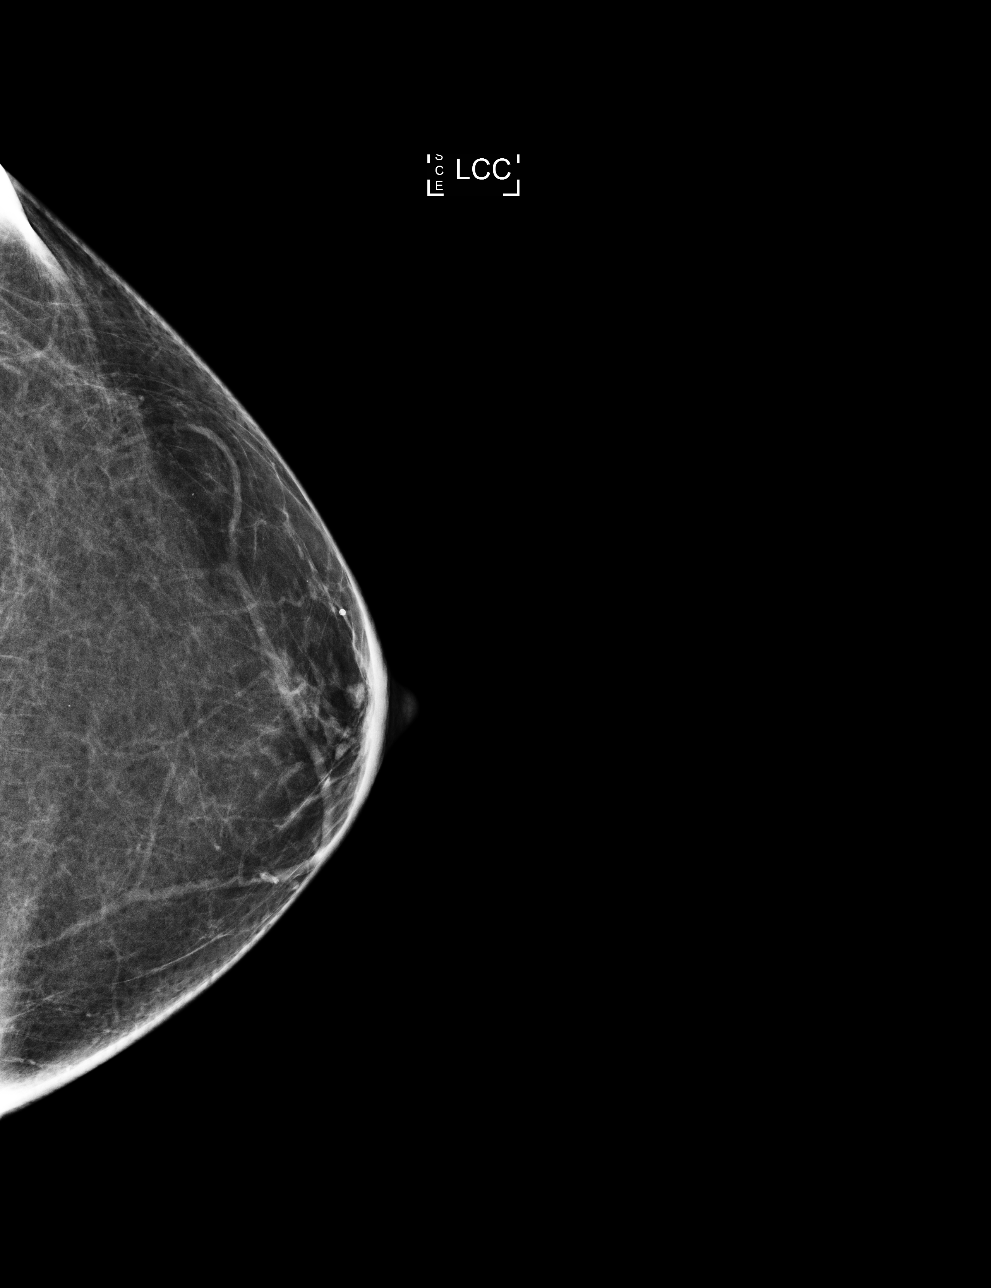

[L MLO]
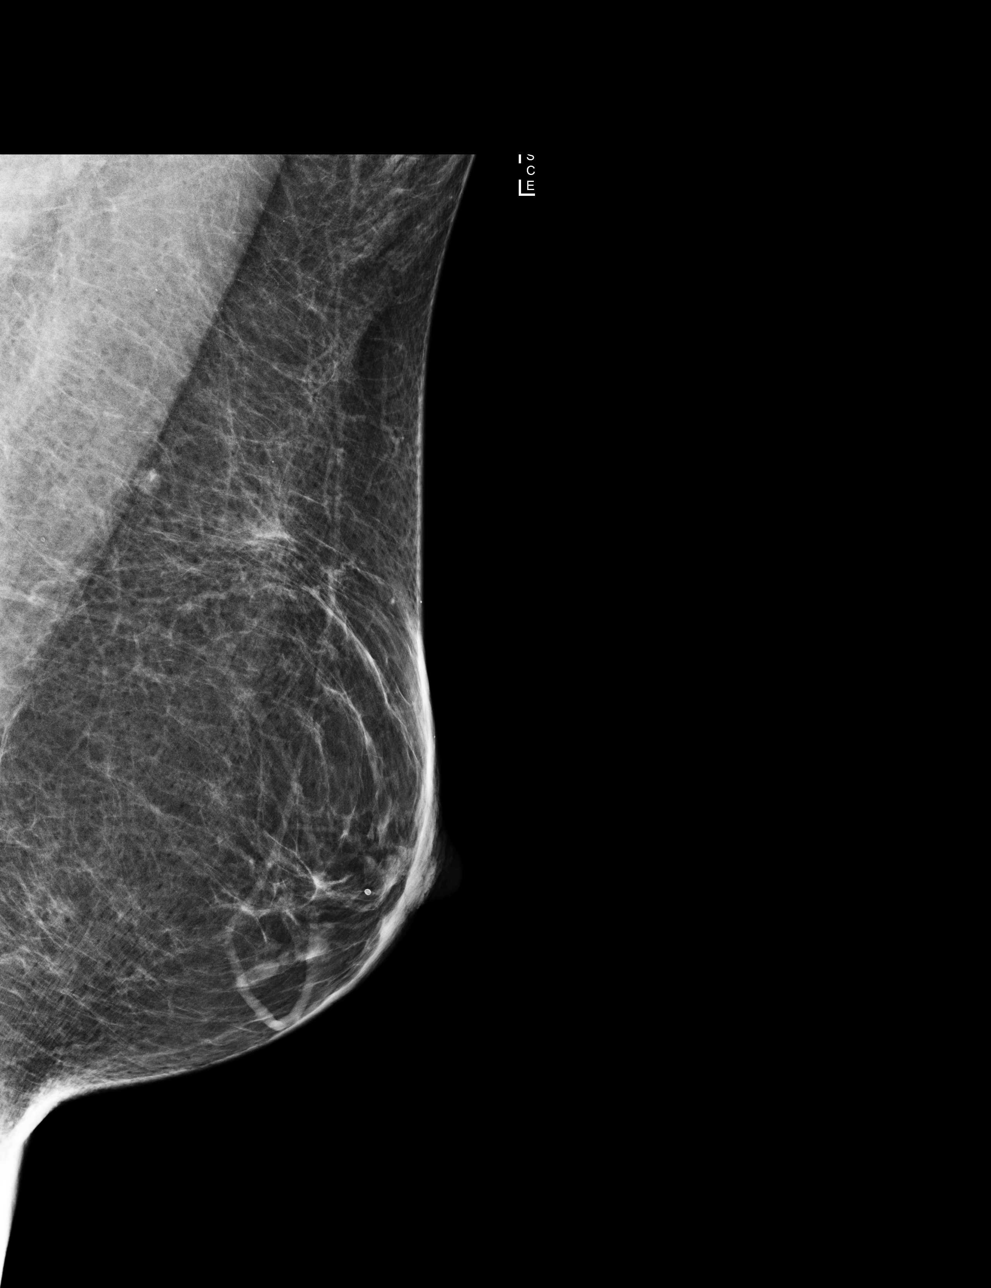

[R MLO]
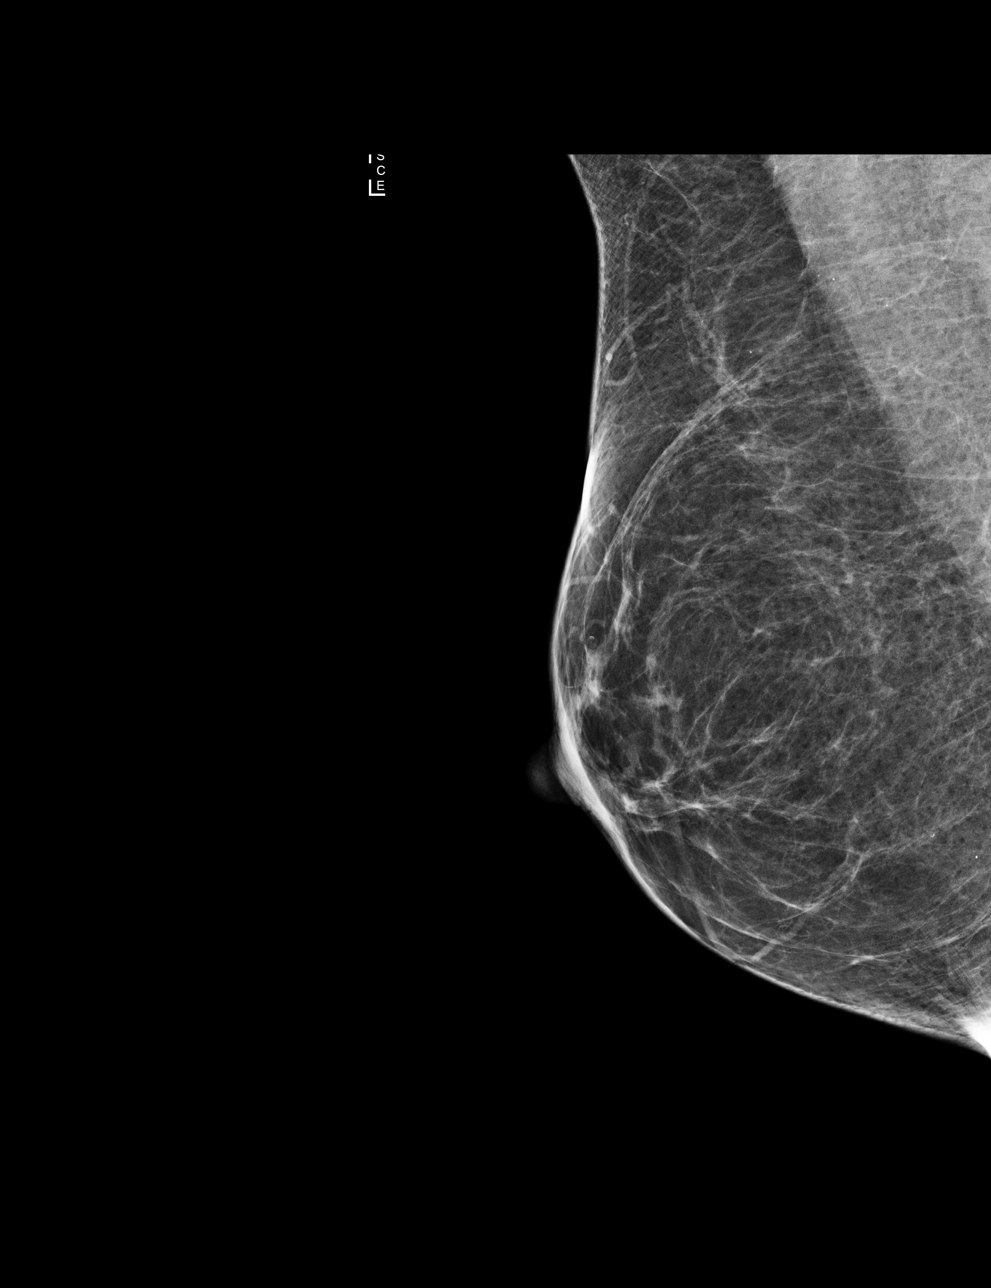

[R CC]
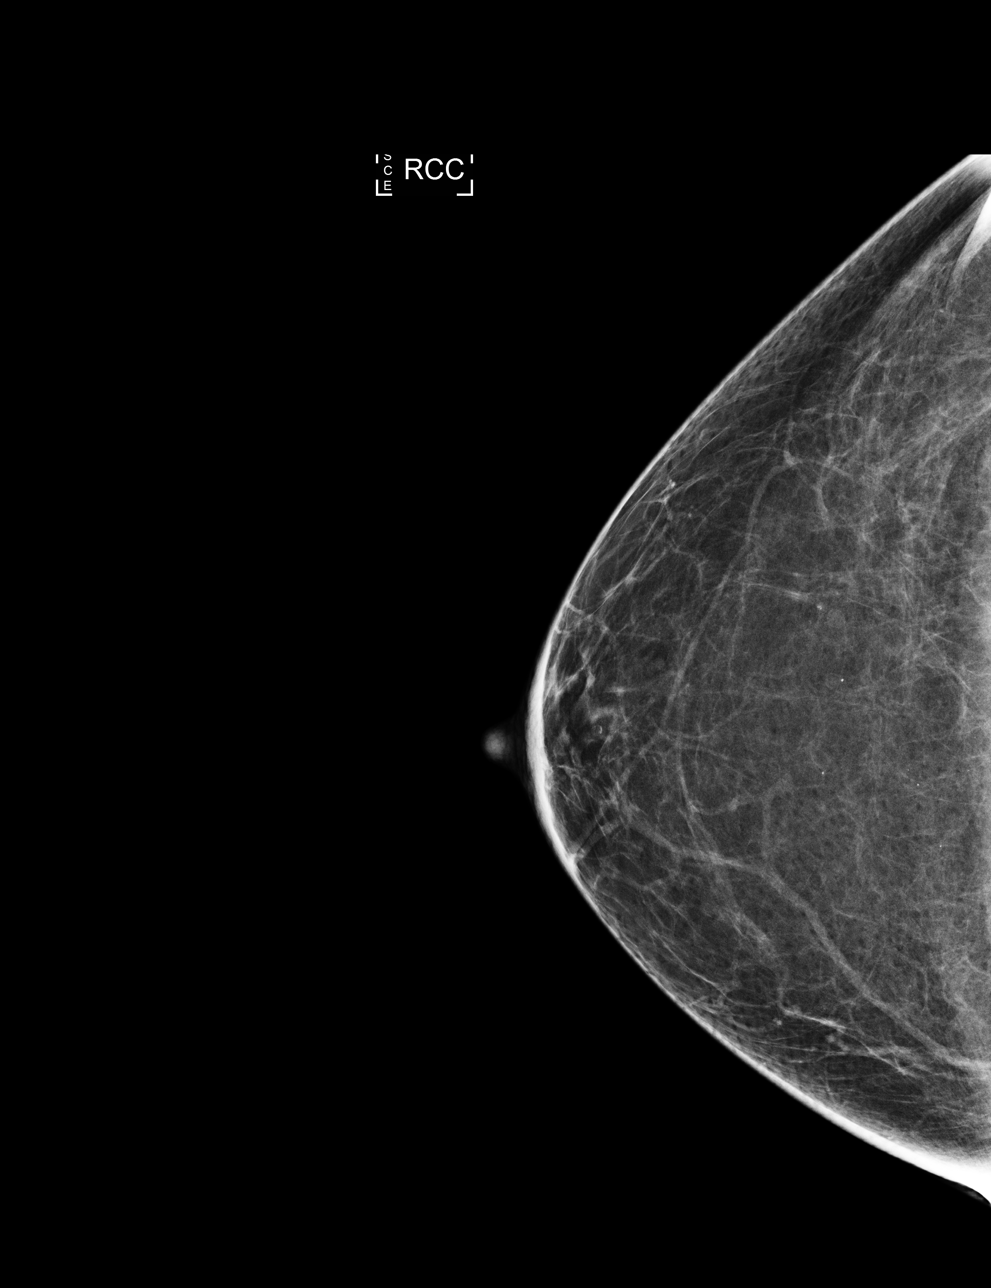

[4 of 4 positions shown; findings below may reference images not displayed]

ACR Breast Density Category b: There are scattered areas of
fibroglandular density.
FINDINGS: There are no findings suspicious for malignancy. Images were
processed with CAD.
IMPRESSION: No mammographic evidence of malignancy. A result letter of this
screening mammogram will be mailed directly to the patient.

RECOMMENDATION:
Screening mammogram in one year. (Code:AS-G-LCT)

BI-RADS CATEGORY  1: Negative.

## 2019-04-14 NOTE — Progress Notes (Signed)
Patient, No Pcp Per   Chief Complaint  Patient presents with  . Vaginal Bleeding    Since Sun night, heavy at times, now light, no abnormal pain    HPI:      [name] is a 58 y.o. [id number] who LMP was No LMP recorded. Patient has had a hysterectomy., presents today for vag bleeding for 3 days. Has been red or brown, light. Noticed with wiping and on pantyliner. Had urinary frequency/urgency yesterday but sx improved today. No dysuria, LBP, fevers. Has had mild cramping. No hx of kidney stones.   Hx of recurrent UTI last 7/19 and saw urology. Had neg C&S but still sx. Had neg pelvic u/s and cystoscopy 8/19. Sx eventually resolved.   S/p TVH due to menorrhagia, given prem vag crm 6/19 with Dr. Tiburcio Pea for atrophy but pt not using it.    Patient Active Problem List   Diagnosis Date Noted  . Gross hematuria 04/15/2019  . Vaginal pain 11/30/2017  . Annual physical exam 04/30/2017  . S/P nail surgery 09/28/2015  . Ingrown toenail 09/28/2015    Past Surgical History:  Procedure Laterality Date  . ABDOMINAL HYSTERECTOMY    . FOOT SURGERY      Family History  Problem Relation Age of Onset  . Lymphoma Father   . Skin cancer Brother     Social History   Socioeconomic History  . Marital status: Married    Spouse name: Not on file  . Number of children: Not on file  . Years of education: Not on file  . Highest education level: Not on file  Occupational History  . Not on file  Social Needs  . Financial resource strain: Not on file  . Food insecurity    Worry: Not on file    Inability: Not on file  . Transportation needs    Medical: Not on file    Non-medical: Not on file  Tobacco Use  . Smoking status: Never Smoker  . Smokeless tobacco: Never Used  Substance and Sexual Activity  . Alcohol use: No    Alcohol/week: 0.0 standard drinks  . Drug use: No  . Sexual activity: Yes    Birth control/protection: Surgical    Comment: Hysterectomy  Lifestyle  .  Physical activity    Days per week: Not on file    Minutes per session: Not on file  . Stress: Not on file  Relationships  . Social Musician on phone: Not on file    Gets together: Not on file    Attends religious service: Not on file    Active member of club or organization: Not on file    Attends meetings of clubs or organizations: Not on file    Relationship status: Not on file  . Intimate partner violence    Fear of current or ex partner: Not on file    Emotionally abused: Not on file    Physically abused: Not on file    Forced sexual activity: Not on file  Other Topics Concern  . Not on file  Social History Narrative  . Not on file    Outpatient Medications Prior to Visit  Medication Sig Dispense Refill  . conjugated estrogens (PREMARIN) vaginal cream Apply 0.5mg  (pea-sized amount)  just inside the vaginal introitus with a finger-tip on  Monday, Wednesday and Friday nights. (Patient not taking: Reported on 02/04/2018) 30 g 12  . estradiol (ESTRACE VAGINAL) 0.1 MG/GM vaginal cream  Apply 0.5mg  (pea-sized amount)  just inside the vaginal introitus with a finger-tip on Monday, Wednesday and Friday nights. (Patient not taking: Reported on 04/15/2019) 30 g 12  . fluticasone (FLONASE) 50 MCG/ACT nasal spray SPRAY TWO SPRAYS IN EACH NOSTRIL ONCE DAILY    . triamcinolone cream (KENALOG) 0.1 % Apply 1 application topically 2 (two) times daily. (Patient not taking: Reported on 04/15/2019) 30 g 2   No facility-administered medications prior to visit.       ROS:  Review of Systems  Constitutional: Negative for fever.  Gastrointestinal: Negative for blood in stool, constipation, diarrhea, nausea and vomiting.  Genitourinary: Positive for frequency, hematuria and vaginal bleeding. Negative for dyspareunia, dysuria, flank pain, urgency, vaginal discharge and vaginal pain.  Musculoskeletal: Negative for back pain.  Skin: Negative for rash.   BREAST: No symptoms    OBJECTIVE:   Vitals:  BP 120/80   Ht 5\' 4"  (1.626 m)   Wt 165 lb (74.8 kg)   BMI 28.32 kg/m   Physical Exam Vitals signs reviewed.  Constitutional:      Appearance: She is well-developed.  Neck:     Musculoskeletal: Normal range of motion.  Pulmonary:     Effort: Pulmonary effort is normal.  Abdominal:     Palpations: Abdomen is soft.     Tenderness: There is abdominal tenderness in the suprapubic area. There is no right CVA tenderness, left CVA tenderness, guarding or rebound.  Genitourinary:    General: Normal vulva.     Pubic Area: No rash.      Labia:        Right: No rash, tenderness or lesion.        Left: No rash, tenderness or lesion.      Urethra: No prolapse, urethral swelling or urethral lesion.     Vagina: Normal. No vaginal discharge, erythema or tenderness.     Uterus: Absent. Not tender.      Adnexa: Right adnexa normal and left adnexa normal.       Right: No mass or tenderness.         Left: No mass or tenderness.    Musculoskeletal: Normal range of motion.  Skin:    General: Skin is warm and dry.  Neurological:     General: No focal deficit present.     Mental Status: She is alert and oriented to person, place, and time.  Psychiatric:        Mood and Affect: Mood normal.        Behavior: Behavior normal.        Thought Content: Thought content normal.        Judgment: Judgment normal.     Results: Results for orders placed or performed in visit on 04/15/19 (from the past 24 hour(s))  POCT Urinalysis Dipstick     Status: Abnormal   Collection Time: 04/15/19  2:44 PM  Result Value Ref Range   Color, UA yellow    Clarity, UA clear    Glucose, UA Negative Negative   Bilirubin, UA neg    Ketones, UA neg    Spec Grav, UA 1.010 1.010 - 1.025   Blood, UA large    pH, UA 6.5 5.0 - 8.0   Protein, UA Negative Negative   Urobilinogen, UA     Nitrite, UA neg    Leukocytes, UA Large (3+) (A) Negative   Appearance     Odor       Assessment/Plan:  Acute cystitis with hematuria -  Plan: POCT Urinalysis Dipstick, Urine Culture, nitrofurantoin, macrocrystal-monohydrate, (MACROBID) 100 MG capsule; Few UTI sx but pos UA/large blood. Rx macrobid. Check C&S. Pt to RTO in 2 wks for repeat UA and sx f/u. If persists, will refer back to urology.  Gross hematuria--treat for UTI with abx. F/u for rechk in 2 wks/sooner prn.   Meds ordered this encounter  Medications  . nitrofurantoin, macrocrystal-monohydrate, (MACROBID) 100 MG capsule    Sig: Take 1 capsule (100 mg total) by mouth 2 (two) times daily for 5 days.    Dispense:  10 capsule    Refill:  0    Order Specific Question:   Supervising Provider    Answer:   Nadara MustardHARRIS, ROBERT P [[id number]]      Return in about 2 weeks (around 04/29/2019) for hematuria f/u.  Alaric Gladwin B. Aisa Schoeppner, PA-C 04/15/2019 2:48 PM

## 2019-04-15 ENCOUNTER — Other Ambulatory Visit: Payer: Self-pay

## 2019-04-15 ENCOUNTER — Encounter: Payer: Self-pay | Admitting: Obstetrics and Gynecology

## 2019-04-15 ENCOUNTER — Ambulatory Visit (INDEPENDENT_AMBULATORY_CARE_PROVIDER_SITE_OTHER): Payer: BC Managed Care – PPO | Admitting: Obstetrics and Gynecology

## 2019-04-15 VITALS — BP 120/80 | Ht 64.0 in | Wt 165.0 lb

## 2019-04-15 DIAGNOSIS — N3001 Acute cystitis with hematuria: Secondary | ICD-10-CM

## 2019-04-15 DIAGNOSIS — R310 Gross hematuria: Secondary | ICD-10-CM | POA: Diagnosis not present

## 2019-04-15 LAB — POCT URINALYSIS DIPSTICK
Bilirubin, UA: NEGATIVE
Glucose, UA: NEGATIVE
Ketones, UA: NEGATIVE
Nitrite, UA: NEGATIVE
Protein, UA: NEGATIVE
Spec Grav, UA: 1.01 (ref 1.010–1.025)
pH, UA: 6.5 (ref 5.0–8.0)

## 2019-04-15 MED ORDER — NITROFURANTOIN MONOHYD MACRO 100 MG PO CAPS: 100.0000 mg | ORAL_CAPSULE | Freq: Two times a day (BID) | ORAL | 0 refills | Status: AC

## 2019-04-15 NOTE — Patient Instructions (Signed)
I value your feedback and entrusting us with your care. If you get a [facility] patient survey, I would appreciate you taking the time to let us know about your experience today. Thank you! 

## 2019-04-17 ENCOUNTER — Telehealth: Payer: Self-pay | Admitting: Obstetrics and Gynecology

## 2019-04-17 LAB — URINE CULTURE: Organism ID, Bacteria: NO GROWTH

## 2019-04-17 NOTE — Telephone Encounter (Signed)
Pt aware of neg C&S for hematuria. Sx have resolved but given extensive amt and neg culture, will refer pt back to urology. Last saw them 2019. Pt to f/u if needs referral back.

## 2019-04-21 ENCOUNTER — Other Ambulatory Visit: Payer: Self-pay

## 2019-04-21 ENCOUNTER — Ambulatory Visit: Payer: BC Managed Care – PPO | Admitting: Physician Assistant

## 2019-04-21 ENCOUNTER — Encounter: Payer: Self-pay | Admitting: Physician Assistant

## 2019-04-21 ENCOUNTER — Telehealth: Payer: Self-pay

## 2019-04-21 VITALS — BP 174/84 | HR 120 | Ht 64.0 in | Wt 163.0 lb

## 2019-04-21 DIAGNOSIS — R319 Hematuria, unspecified: Secondary | ICD-10-CM

## 2019-04-21 DIAGNOSIS — R310 Gross hematuria: Secondary | ICD-10-CM

## 2019-04-21 LAB — URINALYSIS, COMPLETE
Bilirubin, UA: NEGATIVE
Glucose, UA: NEGATIVE
Ketones, UA: NEGATIVE
Nitrite, UA: NEGATIVE
Protein,UA: NEGATIVE
Specific Gravity, UA: 1.015 (ref 1.005–1.030)
Urobilinogen, Ur: 0.2 mg/dL (ref 0.2–1.0)
pH, UA: 7 (ref 5.0–7.5)

## 2019-04-21 LAB — BLADDER SCAN AMB NON-IMAGING

## 2019-04-21 LAB — MICROSCOPIC EXAMINATION: RBC: NONE SEEN /hpf (ref 0–2)

## 2019-04-21 NOTE — Progress Notes (Signed)
04/21/2019 2:26 PM   Sharon Carney 1960/10/05 [id number]  CC: Gross hematuria  HPI: Sharon Carney is a 58 y.o. female who presents today for evaluation of gross hematuria. She is an established [facility] patient who last saw Dr. Richardo Hanks on 02/04/2018 for cystoscopic evaluation of dysuria and frequency. No abnormalities noted; cytology returned without dysplasia or malignancy but with dysmorphic red cells suggestive of glomerular and/or renal tubular bleeding.  She sought care with her OBGYN on 04/15/2019 with a 3-day history of vaginal bleeding. Patient is s/p hysterectomy. She also reported urinary frequency and urgency and denied dysuria and fevers. Urine dip with 3+ blood without microscopic evaluation; no blood visualized on pelvic exam. Urine culture negative. She was treated with nitrofurantoin 100mg  BID x5 days which she completed yesterday.  Today, she states she first noticed blood in her urine 8 days ago, which resolved after 24 hours and subsequently returned 2 days ago.  She also reports suprapubic cramping and pressure x1 day.  She is not on any anticoagulants.  She is a never smoker, never worked in , no history of autoimmune conditions requiring medication.  In-office UA today positive for trace-lysed blood and 2+ leukocyte esterase; urine microscopy with 6-10 WBCs/HPF and moderate bacteria.  PMH: Past Medical History:  Diagnosis Date  . Anxiety   . GERD (gastroesophageal reflux disease)   . Hyperlipemia   . Ingrown toenail 09/28/2015  . Pre-diabetes   . S/P nail surgery 09/28/2015  . Vaginal pain 11/30/2017    Surgical History: Past Surgical History:  Procedure Laterality Date  . ABDOMINAL HYSTERECTOMY    . FOOT SURGERY      Home Medications:  Allergies as of 04/21/2019      Reactions   Ciprofloxacin Hcl    Clindamycin Hives   Codeine       Medication List       Accurate as of April 21, 2019  2:26 PM. If you have any questions, ask your nurse or  doctor.        STOP taking these medications   conjugated estrogens vaginal cream Commonly known as: Premarin Stopped by: April 23, 2019, PA-C   estradiol 0.1 MG/GM vaginal cream Commonly known as: ESTRACE VAGINAL Stopped by: Carman Ching, PA-C   triamcinolone cream 0.1 % Commonly known as: KENALOG Stopped by: Carman Ching, PA-C     TAKE these medications   fluticasone 50 MCG/ACT nasal spray Commonly known as: FLONASE SPRAY TWO SPRAYS IN EACH NOSTRIL ONCE DAILY       Allergies:  Allergies  Allergen Reactions  . Ciprofloxacin Hcl   . Clindamycin Hives  . Codeine     Family History: Family History  Problem Relation Age of Onset  . Lymphoma Father   . Skin cancer Brother     Social History:   reports that she has never smoked. She has never used smokeless tobacco. She reports that she does not drink alcohol or use drugs.  ROS: UROLOGY Frequent Urination?: Yes Hard to postpone urination?: No Burning/pain with urination?: No Get up at night to urinate?: No Leakage of urine?: No Urine stream starts and stops?: No Trouble starting stream?: No Do you have to strain to urinate?: No Blood in urine?: Yes Urinary tract infection?: No Sexually transmitted disease?: No Injury to kidneys or bladder?: No Painful intercourse?: No Weak stream?: No Currently pregnant?: No Vaginal bleeding?: Yes Last menstrual period?: n  Gastrointestinal Nausea?: No Vomiting?: No Indigestion/heartburn?: No Diarrhea?: No Constipation?: No  Constitutional Fever: No Night  sweats?: No Weight loss?: No Fatigue?: No  Skin Skin rash/lesions?: No Itching?: No  Eyes Blurred vision?: No Double vision?: No  Ears/Nose/Throat Sore throat?: No Sinus problems?: No  Hematologic/Lymphatic Swollen glands?: No Easy bruising?: No  Cardiovascular Leg swelling?: No Chest pain?: No  Respiratory Cough?: No Shortness of breath?: No  Endocrine  Excessive thirst?: No  Musculoskeletal Back pain?: No Joint pain?: No  Neurological Headaches?: No Dizziness?: No  Psychologic Depression?: No Anxiety?: No  Physical Exam: BP (!) 174/84   Pulse (!) 120   Ht 5\' 4"  (1.626 m)   Wt 163 lb (73.9 kg)   BMI 27.98 kg/m   Constitutional:  Alert and oriented, no acute distress, nontoxic appearing HEENT: [place], AT Cardiovascular: No clubbing, cyanosis, or edema Respiratory: Normal respiratory effort, no increased work of breathing Skin: No rashes, bruises or suspicious lesions Neurologic: Grossly intact, no focal deficits, moving all 4 extremities Psychiatric: Normal mood and affect  Laboratory Data: Results for orders placed or performed in visit on 04/21/19  BLADDER SCAN AMB NON-IMAGING  Result Value Ref Range   Scan Result 53mL    Assessment & Plan:   1. 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  Evaluated by OB/GYN, gynecologic cause not found.  I explained to the patient that there are many things that may cause gross hematuria.  No clear risk factors for urothelial malignancy.  However, gross hematuria warrants work-up per [facility] guidelines.  UA concerning for infection today despite recent completion of antibiotics.  Will send for culture and treat accordingly.  I do not believe any possible UTI is contributing to her gross hematuria, given negative culture last week upon initial presentation with this symptom.  CT urogram ordered today, patient to return in 1 month for cystoscopy and results.  Patient expressed understanding. - BLADDER SCAN AMB NON-IMAGING - Urinalysis, Complete - CULTURE, URINE COMPREHENSIVE - CT HEMATURIA WORKUP  Return in about 4 weeks (around 05/19/2019) for CTU results and cystoscopy.  Debroah Loop, PA-C  [facility] [address] [place], [place] [address] [phone]

## 2019-04-21 NOTE — Telephone Encounter (Signed)
Spoke with patient.  She has seen GYN for her issue.  She was told to follow up with urology for bladder review.

## 2019-04-23 LAB — CULTURE, URINE COMPREHENSIVE

## 2019-04-25 ENCOUNTER — Telehealth: Payer: Self-pay | Admitting: Physician Assistant

## 2019-04-25 NOTE — Telephone Encounter (Signed)
Patient returned call and negative urine culture results were provided.  Patient expressed understanding.

## 2019-04-25 NOTE — Telephone Encounter (Signed)
Called patient to report her negative urine culture results. LMOM to return my call.

## 2019-04-28 ENCOUNTER — Ambulatory Visit: Admission: RE | Admit: 2019-04-28 | Payer: BC Managed Care – PPO | Source: Ambulatory Visit

## 2019-04-29 ENCOUNTER — Ambulatory Visit: Payer: BC Managed Care – PPO | Admitting: Obstetrics and Gynecology

## 2019-05-05 ENCOUNTER — Ambulatory Visit
Admission: RE | Admit: 2019-05-05 | Discharge: 2019-05-05 | Disposition: A | Discharge status: Home / Self Care | Payer: BC Managed Care – PPO | Source: Ambulatory Visit | Attending: Physician Assistant | Admitting: Physician Assistant

## 2019-05-05 ENCOUNTER — Other Ambulatory Visit: Payer: Self-pay

## 2019-05-05 DIAGNOSIS — K5730 Diverticulosis of large intestine without perforation or abscess without bleeding: Secondary | ICD-10-CM | POA: Diagnosis not present

## 2019-05-05 DIAGNOSIS — R310 Gross hematuria: Secondary | ICD-10-CM | POA: Diagnosis not present

## 2019-05-05 MED ORDER — IOHEXOL 300 MG/ML  SOLN
125.0000 mL | Freq: Once | INTRAMUSCULAR | Status: AC | PRN
  Administered 2019-05-05: 125 mL via INTRAVENOUS

## 2019-05-07 ENCOUNTER — Telehealth: Payer: Self-pay | Admitting: Physician Assistant

## 2019-05-07 NOTE — Telephone Encounter (Signed)
I just spoke with the patient via telephone to report the results of her CT urogram.  I explained that there were no abnormalities noted within her urinary tract to explain her recent gross hematuria.  I urged her to follow-up with her scheduled cystoscopy with Dr. Bernardo Heater later this month.  She expressed a desire to cancel her cystoscopy, citing financial concerns.  I urged her strongly to undergo the cystoscopy, explaining that we have not yet fully evaluated her urinary tract for all of the potential causes of her hematuria.  I explained that there could be tumors inside her bladder that we have not yet been able to visualize that would only be seen via cystoscopy.  She insists that she does not want to spend more money on this work-up, given that her CT was negative.  I urged her to continue with the scheduled cystoscopy, stating that she should still be evaluated even if she is not actively bleeding.  If she cannot afford cystoscopy at this time, I urged her to reschedule it as soon as possible.  She expressed understanding.  I am concerned that patient may not follow-up as scheduled.  I strongly believe that she needs to undergo cystoscopy for evaluation of her bladder for potential sources of gross hematuria.  I followed up on our phone conversation with a certified letter attesting to this.

## 2019-05-26 ENCOUNTER — Other Ambulatory Visit: Payer: Self-pay | Admitting: Urology

## 2019-10-20 DIAGNOSIS — H15101 Unspecified episcleritis, right eye: Secondary | ICD-10-CM | POA: Diagnosis not present

## 2019-10-29 DIAGNOSIS — H11151 Pinguecula, right eye: Secondary | ICD-10-CM | POA: Diagnosis not present

## 2020-03-15 DIAGNOSIS — H6123 Impacted cerumen, bilateral: Secondary | ICD-10-CM | POA: Diagnosis not present

## 2020-03-15 DIAGNOSIS — H6983 Other specified disorders of Eustachian tube, bilateral: Secondary | ICD-10-CM | POA: Diagnosis not present

## 2021-01-25 DIAGNOSIS — R0982 Postnasal drip: Secondary | ICD-10-CM | POA: Diagnosis not present

## 2021-01-25 DIAGNOSIS — R0981 Nasal congestion: Secondary | ICD-10-CM | POA: Diagnosis not present

## 2021-02-08 ENCOUNTER — Other Ambulatory Visit: Payer: Self-pay | Admitting: Unknown Physician Specialty

## 2021-02-08 ENCOUNTER — Ambulatory Visit
Admission: RE | Admit: 2021-02-08 | Discharge: 2021-02-08 | Disposition: A | Discharge status: Home / Self Care | Payer: BC Managed Care – PPO | Source: Ambulatory Visit | Attending: Unknown Physician Specialty | Admitting: Unknown Physician Specialty

## 2021-02-08 ENCOUNTER — Ambulatory Visit
Admission: RE | Admit: 2021-02-08 | Discharge: 2021-02-08 | Disposition: A | Discharge status: Home / Self Care | Payer: BC Managed Care – PPO | Attending: Unknown Physician Specialty | Admitting: Unknown Physician Specialty

## 2021-02-08 ENCOUNTER — Other Ambulatory Visit: Payer: Self-pay

## 2021-02-08 DIAGNOSIS — J329 Chronic sinusitis, unspecified: Secondary | ICD-10-CM | POA: Diagnosis not present

## 2021-02-08 DIAGNOSIS — J019 Acute sinusitis, unspecified: Secondary | ICD-10-CM | POA: Diagnosis not present

## 2021-03-11 DIAGNOSIS — Z0000 Encounter for general adult medical examination without abnormal findings: Secondary | ICD-10-CM | POA: Diagnosis not present

## 2021-03-11 DIAGNOSIS — E6609 Other obesity due to excess calories: Secondary | ICD-10-CM | POA: Diagnosis not present

## 2021-03-11 DIAGNOSIS — G252 Other specified forms of tremor: Secondary | ICD-10-CM | POA: Diagnosis not present

## 2021-03-11 DIAGNOSIS — R030 Elevated blood-pressure reading, without diagnosis of hypertension: Secondary | ICD-10-CM | POA: Diagnosis not present

## 2021-03-14 ENCOUNTER — Other Ambulatory Visit: Payer: Self-pay | Admitting: Family Medicine

## 2021-03-14 ENCOUNTER — Telehealth: Payer: Self-pay

## 2021-03-14 DIAGNOSIS — Z131 Encounter for screening for diabetes mellitus: Secondary | ICD-10-CM | POA: Diagnosis not present

## 2021-03-14 DIAGNOSIS — Z1159 Encounter for screening for other viral diseases: Secondary | ICD-10-CM | POA: Diagnosis not present

## 2021-03-14 DIAGNOSIS — Z1231 Encounter for screening mammogram for malignant neoplasm of breast: Secondary | ICD-10-CM

## 2021-03-14 DIAGNOSIS — Z0000 Encounter for general adult medical examination without abnormal findings: Secondary | ICD-10-CM | POA: Diagnosis not present

## 2021-03-14 DIAGNOSIS — Z13220 Encounter for screening for lipoid disorders: Secondary | ICD-10-CM | POA: Diagnosis not present

## 2021-03-14 NOTE — Telephone Encounter (Signed)
Pt. Requesting information about a colonoscopy.

## 2021-03-15 NOTE — Telephone Encounter (Signed)
Returned patients call. LVM to call back °

## 2021-03-18 DIAGNOSIS — E782 Mixed hyperlipidemia: Secondary | ICD-10-CM | POA: Insufficient documentation

## 2021-03-31 ENCOUNTER — Ambulatory Visit
Admission: RE | Admit: 2021-03-31 | Discharge: 2021-03-31 | Disposition: A | Discharge status: Home / Self Care | Payer: BC Managed Care – PPO | Source: Ambulatory Visit | Attending: Family Medicine | Admitting: Family Medicine

## 2021-03-31 ENCOUNTER — Other Ambulatory Visit: Payer: Self-pay

## 2021-03-31 DIAGNOSIS — Z1231 Encounter for screening mammogram for malignant neoplasm of breast: Secondary | ICD-10-CM | POA: Diagnosis not present

## 2022-03-22 ENCOUNTER — Other Ambulatory Visit: Payer: Self-pay | Admitting: Family Medicine

## 2022-03-22 DIAGNOSIS — Z1231 Encounter for screening mammogram for malignant neoplasm of breast: Secondary | ICD-10-CM

## 2022-04-19 ENCOUNTER — Ambulatory Visit
Admission: RE | Admit: 2022-04-19 | Discharge: 2022-04-19 | Disposition: A | Discharge status: Home / Self Care | Payer: BC Managed Care – PPO | Source: Ambulatory Visit | Attending: Family Medicine | Admitting: Family Medicine

## 2022-04-19 DIAGNOSIS — Z1231 Encounter for screening mammogram for malignant neoplasm of breast: Secondary | ICD-10-CM | POA: Diagnosis present

## 2022-09-11 ENCOUNTER — Ambulatory Visit (INDEPENDENT_AMBULATORY_CARE_PROVIDER_SITE_OTHER): Payer: BC Managed Care – PPO

## 2022-09-11 ENCOUNTER — Ambulatory Visit: Payer: BC Managed Care – PPO | Admitting: Podiatry

## 2022-09-11 DIAGNOSIS — M76822 Posterior tibial tendinitis, left leg: Secondary | ICD-10-CM | POA: Diagnosis not present

## 2022-09-11 DIAGNOSIS — S93402A Sprain of unspecified ligament of left ankle, initial encounter: Secondary | ICD-10-CM | POA: Diagnosis not present

## 2022-09-11 DIAGNOSIS — S93422A Sprain of deltoid ligament of left ankle, initial encounter: Secondary | ICD-10-CM

## 2022-09-11 NOTE — Progress Notes (Signed)
  Subjective:  Patient ID: Sharon Carney, female    DOB: 05-14-1961,  MRN: [id number]  Chief Complaint  Patient presents with   Ankle Pain    np re est care/ last seen 2018/ twisted left ankle a few weeks ago and still having pain    62 y.o. female presents with the above complaint. History confirmed with patient.  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    Radiographs: Multiple views x-ray of the left foot: no fracture, dislocation, swelling or degenerative changes noted and pes planus Assessment:   1. Sprain of deltoid ligament of left ankle, initial encounter   2. Posterior tibial tendon dysfunction (PTTD) of left lower extremity      Plan:  Patient was evaluated and treated and all questions answered.  We discussed that she has a mild ankle sprain and likely posterior tibial tendinitis secondary to pes planus deformity.  Discussed supportive shoe gear which she has, option of custom molded orthoses if not improving.  For now I recommended home PT exercises given.  Also discussed using Voltaren gel topically for anti-inflammatory relief.  Return if symptoms worsen or fail to improve.

## 2022-09-11 NOTE — Patient Instructions (Signed)

## 2022-09-15 IMAGING — MG MM DIGITAL SCREENING BILAT W/ TOMO AND CAD
8 series · 9 of 24 positions shown · non-contrast
Comparison: Previous exam(s).

CLINICAL DATA: Screening.

EXAM:
DIGITAL SCREENING BILATERAL MAMMOGRAM WITH TOMOSYNTHESIS AND CAD
TECHNIQUE: Bilateral screening digital craniocaudal and mediolateral oblique
mammograms were obtained. Bilateral screening digital breast
tomosynthesis was performed. The images were evaluated with
computer-aided detection.

[R MLO synth-2D]
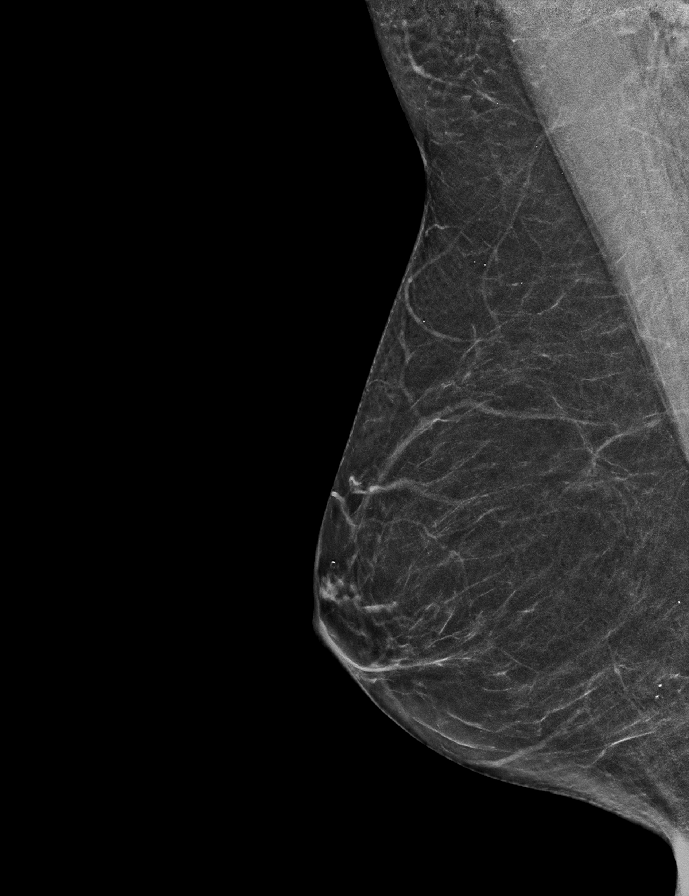

[R CC synth-2D]
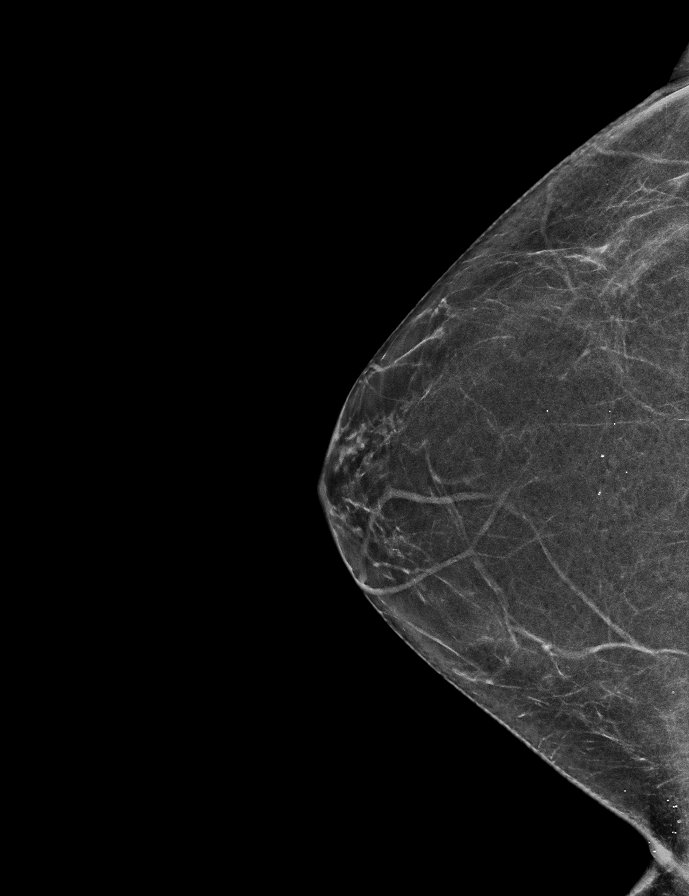

[L CC synth-2D]
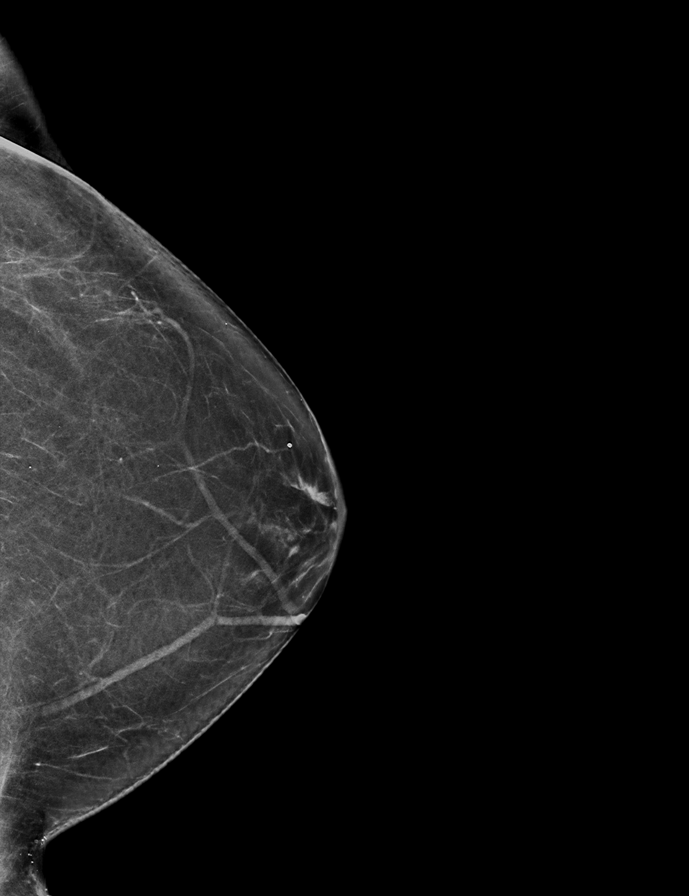

[L MLO synth-2D]
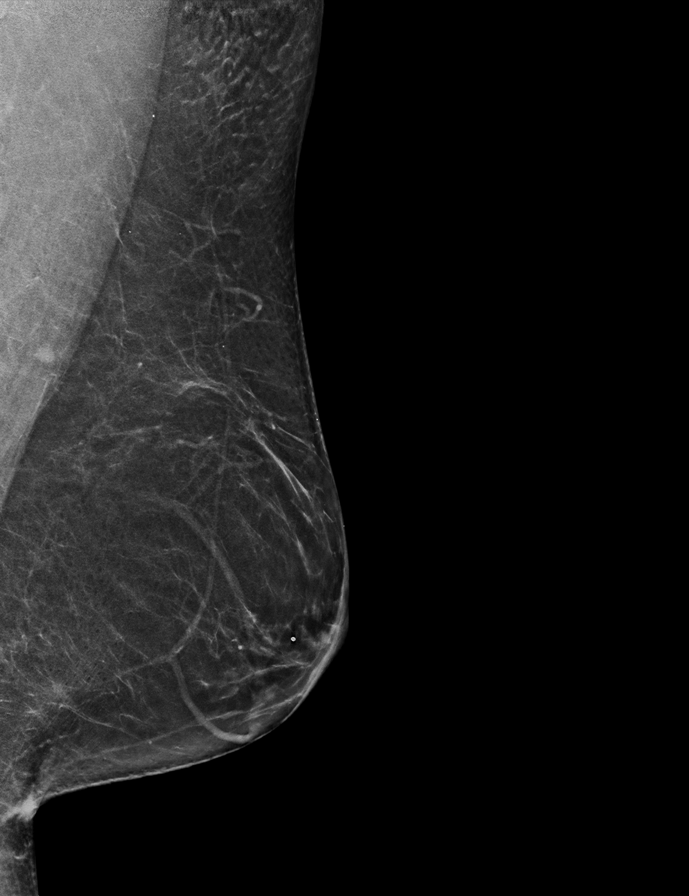

[L MLO tomo · 2 of 58 frames shown]
[frame 19/58]
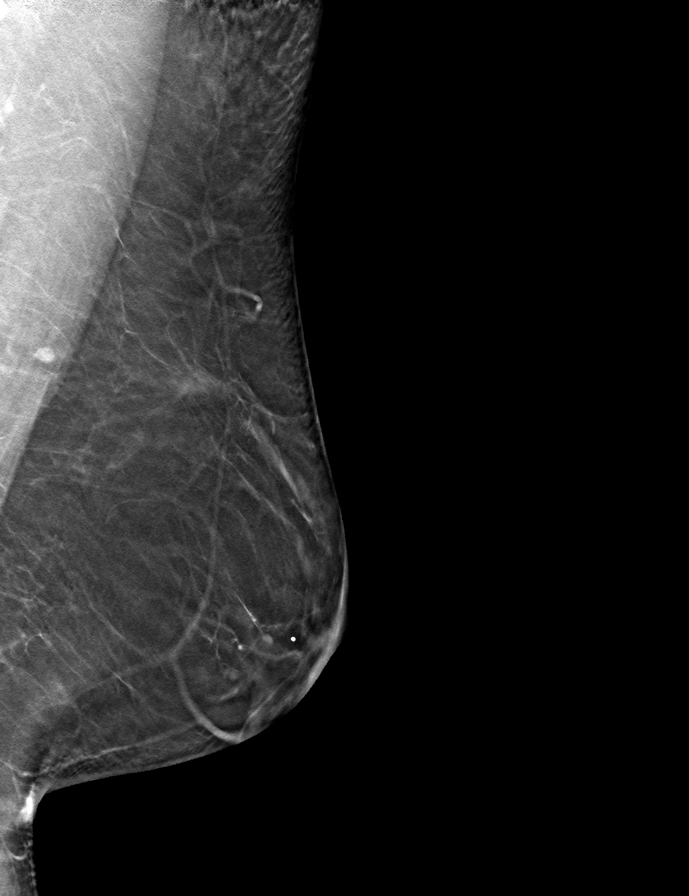
[frame 29/58]
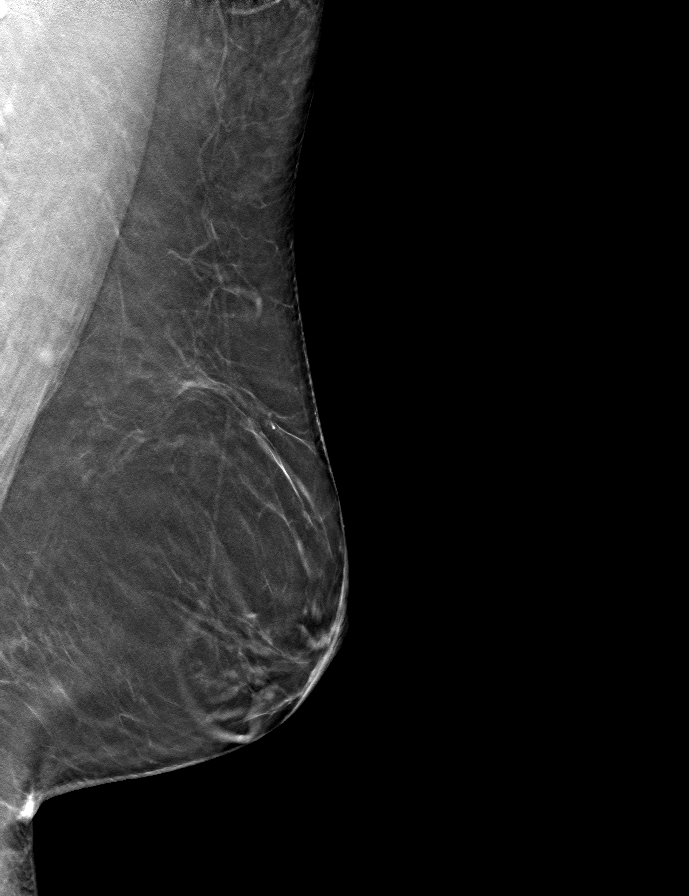

[R CC tomo · tomo slice 28/55.0]
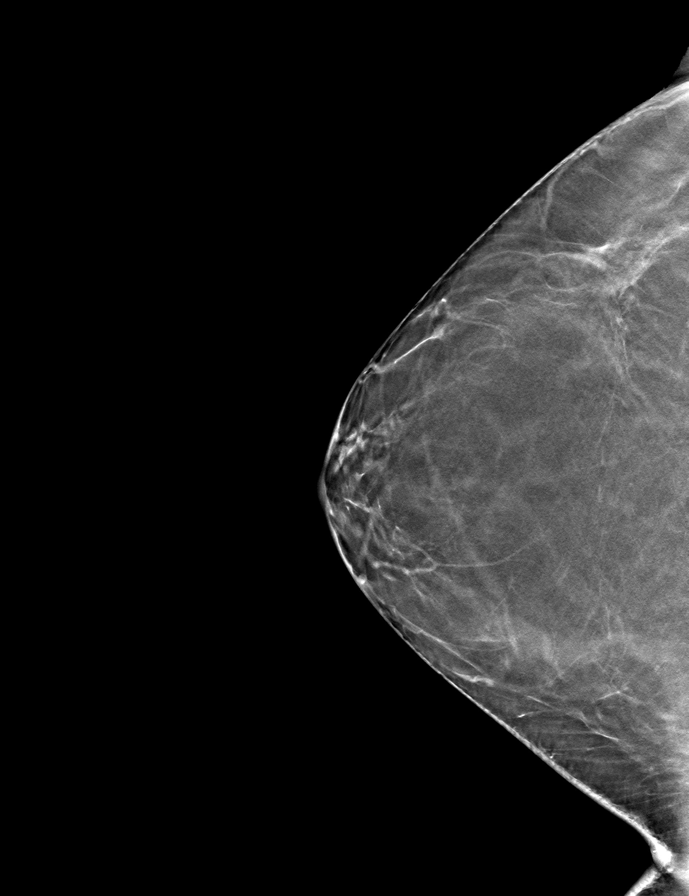

[R MLO tomo · tomo slice 28/55.0]
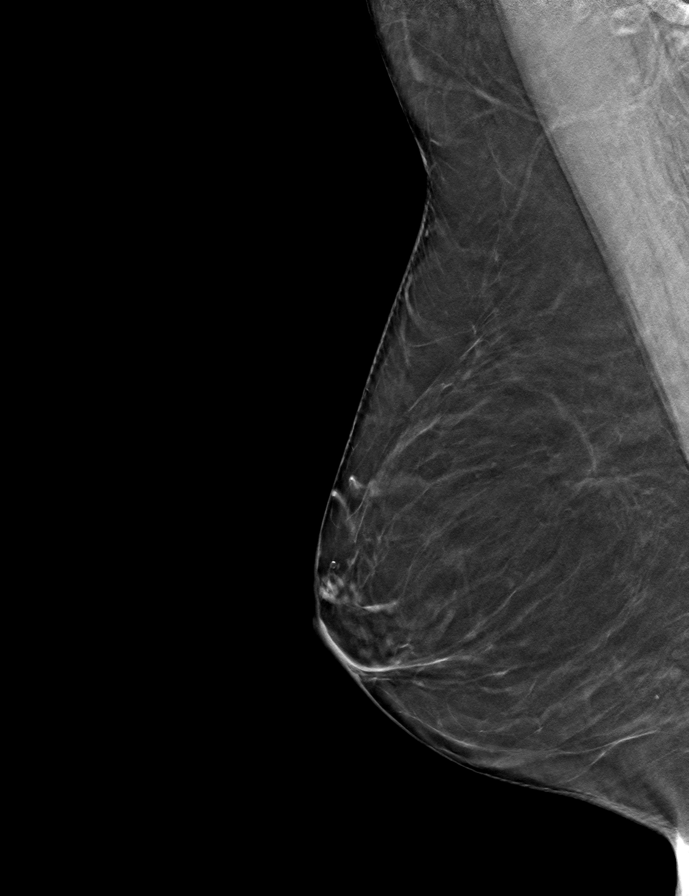

[L CC tomo · tomo slice 31/61.0]
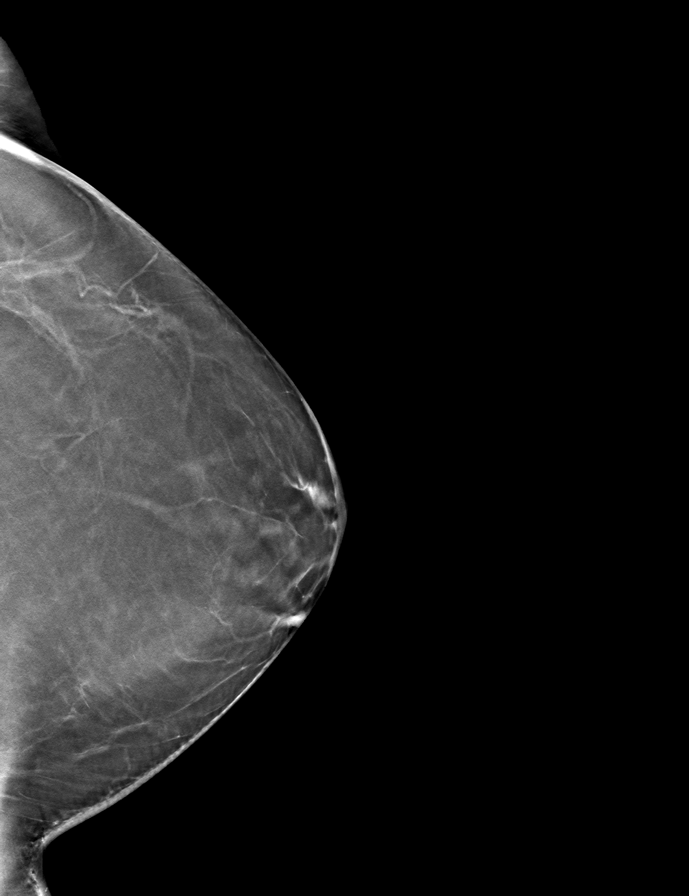

[9 of 24 positions shown; findings below may reference images not displayed]

ACR Breast Density Category b: There are scattered areas of
fibroglandular density.
FINDINGS: There are no findings suspicious for malignancy.
IMPRESSION: No mammographic evidence of malignancy. A result letter of this
screening mammogram will be mailed directly to the patient.

RECOMMENDATION:
Screening mammogram in one year. (Code:51-O-LD2)

BI-RADS CATEGORY  1: Negative.

## 2022-09-18 ENCOUNTER — Ambulatory Visit: Payer: BC Managed Care – PPO | Admitting: Podiatry

## 2023-01-31 ENCOUNTER — Telehealth: Payer: Self-pay

## 2023-01-31 NOTE — Telephone Encounter (Signed)
Spoken to patient and inform her that I am unable to pull up her last colonoscopy with Dr Servando Snare from 2016.  I have suggested patient to have pcp send a referral and I find out what I can for patient since I am still don't know everything.  I was told by Shawna Orleans that Morrie Sheldon or [facility] may have access to the old files.

## 2023-01-31 NOTE — Telephone Encounter (Signed)
Pt left message inquiring about scheduling another colonoscopy last procedure was 2016 please return call

## 2023-02-05 NOTE — Telephone Encounter (Signed)
Spoken to patient and inform her that she is not due until 2026.

## 2023-02-05 NOTE — Telephone Encounter (Signed)
Message from [facility].  Report printed and put in for scanning. She isn't due until 2026 per the report.

## 2023-02-06 ENCOUNTER — Encounter: Payer: Self-pay | Admitting: Gastroenterology

## 2023-03-20 ENCOUNTER — Other Ambulatory Visit: Payer: Self-pay | Admitting: Family Medicine

## 2023-03-20 DIAGNOSIS — Z1231 Encounter for screening mammogram for malignant neoplasm of breast: Secondary | ICD-10-CM

## 2023-04-24 ENCOUNTER — Ambulatory Visit
Admission: RE | Admit: 2023-04-24 | Discharge: 2023-04-24 | Disposition: A | Discharge status: Home / Self Care | Payer: BC Managed Care – PPO | Source: Ambulatory Visit | Attending: Family Medicine | Admitting: Family Medicine

## 2023-04-24 DIAGNOSIS — Z1231 Encounter for screening mammogram for malignant neoplasm of breast: Secondary | ICD-10-CM | POA: Insufficient documentation

## 2023-08-09 ENCOUNTER — Ambulatory Visit: Payer: BC Managed Care – PPO | Admitting: Podiatry

## 2023-10-02 ENCOUNTER — Ambulatory Visit: Admitting: Podiatry

## 2023-10-02 DIAGNOSIS — S90112A Contusion of left great toe without damage to nail, initial encounter: Secondary | ICD-10-CM

## 2023-10-02 NOTE — Progress Notes (Signed)
  Subjective:  Patient ID: Sharon Carney, female    DOB: 1960-06-27,  MRN: [id number]  Chief Complaint  Patient presents with   Nail Problem    toenail bruise    63 y.o. female presents with the above complaint. Patient presents with left hallux so toenail contusion. Patient states the bruise told the little bit she wanted get evaluated. She does not want to have her removed. She has not seen and was probably seeing me. She start date denies any other developing skills fiery tendon leaking nature   Review of Systems: Negative except as noted in the HPI. Denies N/V/F/Ch.  Past Medical History:  Diagnosis Date   Anxiety    GERD (gastroesophageal reflux disease)    Hyperlipemia    Ingrown toenail 09/28/2015   Pre-diabetes    S/P nail surgery 09/28/2015   Vaginal pain 11/30/2017    Current Outpatient Medications:    albuterol (VENTOLIN HFA) 108 (90 Base) MCG/ACT inhaler, Inhale into the lungs., Disp: , Rfl:    methylPREDNISolone (MEDROL DOSEPAK) 4 MG TBPK tablet, SMARTSIG:- Tablet(s) By Mouth -, Disp: , Rfl:    pseudoephedrine (SUDAFED SINUS CONGESTION 12HR) 120 MG 12 hr tablet, Take by mouth., Disp: , Rfl:    acetaminophen (TYLENOL) 500 MG tablet, Take by mouth., Disp: , Rfl:    ascorbic acid (VITAMIN C) 500 MG tablet, Take by mouth., Disp: , Rfl:    atorvastatin (LIPITOR) 20 MG tablet, Take 20 mg by mouth daily., Disp: , Rfl:    Calcium Carb-Cholecalciferol 600-10 MG-MCG TABS, Take by mouth., Disp: , Rfl:    cetirizine (ZYRTEC) 10 MG tablet, Take by mouth., Disp: , Rfl:    diphenhydramine-acetaminophen (TYLENOL PM EXTRA STRENGTH) 25-500 MG TABS tablet, Take by mouth., Disp: , Rfl:    fluticasone (FLONASE) 50 MCG/ACT nasal spray, SPRAY TWO SPRAYS IN EACH NOSTRIL ONCE DAILY, Disp: , Rfl:    Multiple Vitamin (MULTI-VITAMIN) tablet, Take 1 tablet by mouth daily., Disp: , Rfl:   Social History   Tobacco Use  Smoking Status Never  Smokeless Tobacco Never    Allergies  Allergen  Reactions   Ciprofloxacin Hcl    Clindamycin Hives   Codeine    Objective:  There were no vitals filed for this visit. There is no height or weight on file to calculate BMI. Constitutional Well developed. Well nourished.  Vascular Dorsalis pedis pulses palpable bilaterally. Posterior tibial pulses palpable bilaterally. Capillary refill normal to all digits.  No cyanosis or clubbing noted. Pedal hair growth normal.  Neurologic Normal speech. Oriented to person, place, and time. Epicritic sensation to light touch grossly present bilaterally.  Longer o left left hallux no confusion noted less than 15% hematoma involved in the nail plate. Nail as well adhere to the underlying nail bed. No loosening noted   Normal joint ROM without pain or crepitus bilaterally. No visible deformities. No bony tenderness.   Radiographs: None Assessment:   1. Contusion of left great toe without damage to nail, initial encounter    Plan:  Patient was evaluated and treated and all questions answered.  Left hallux no contusion without detachment -all contusions were discussed with the patient extensive detail given the amount of pain that she's having she will continue clinically monitor. If there is no self resentment we discussed total available sign in the future she states understanding.  No follow-ups on file.

## 2024-06-06 ENCOUNTER — Other Ambulatory Visit: Payer: Self-pay | Admitting: Family Medicine

## 2024-06-06 DIAGNOSIS — Z1231 Encounter for screening mammogram for malignant neoplasm of breast: Secondary | ICD-10-CM

## 2024-06-20 ENCOUNTER — Ambulatory Visit
Admission: RE | Admit: 2024-06-20 | Discharge: 2024-06-20 | Disposition: A | Discharge status: Home / Self Care | Source: Ambulatory Visit | Attending: Family Medicine | Admitting: Family Medicine

## 2024-06-20 DIAGNOSIS — Z1231 Encounter for screening mammogram for malignant neoplasm of breast: Secondary | ICD-10-CM | POA: Diagnosis present

## 2024-06-27 ENCOUNTER — Telehealth: Payer: Self-pay

## 2024-06-27 ENCOUNTER — Other Ambulatory Visit: Payer: Self-pay

## 2024-06-27 DIAGNOSIS — Z1211 Encounter for screening for malignant neoplasm of colon: Secondary | ICD-10-CM

## 2024-06-27 MED ORDER — NA SULFATE-K SULFATE-MG SULF 17.5-3.13-1.6 GM/177ML PO SOLN: 1.0000 | Freq: Once | ORAL | 0 refills | Status: AC

## 2024-06-27 NOTE — Telephone Encounter (Signed)
 Gastroenterology Pre-Procedure Review  Request Date: 10/21/24 Requesting Physician: Dr. Jinny  PATIENT REVIEW QUESTIONS: The patient responded to the following health history questions as indicated:    1. Are you having any GI issues? no 2. Do you have a personal history of Polyps? no 3. Do you have a family history of Colon Cancer or Polyps? no 4. Diabetes Mellitus? no 5. Joint replacements in the past 12 months?no 6. Major health problems in the past 3 months?no 7. Any artificial heart valves, MVP, or defibrillator?no    MEDICATIONS & ALLERGIES:    Patient reports the following regarding taking any anticoagulation/antiplatelet therapy:   Plavix, Coumadin, Eliquis, Xarelto, Lovenox, Pradaxa, Brilinta, or Effient? no Aspirin? no  Patient confirms/reports the following medications:  Current Outpatient Medications  Medication Sig Dispense Refill   acetaminophen (TYLENOL) 500 MG tablet Take by mouth.     albuterol (VENTOLIN HFA) 108 (90 Base) MCG/ACT inhaler Inhale into the lungs.     ascorbic acid (VITAMIN C) 500 MG tablet Take by mouth.     atorvastatin (LIPITOR) 20 MG tablet Take 20 mg by mouth daily.     Calcium Carb-Cholecalciferol 600-10 MG-MCG TABS Take by mouth.     cetirizine (ZYRTEC) 10 MG tablet Take by mouth.     diphenhydramine-acetaminophen (TYLENOL PM EXTRA STRENGTH) 25-500 MG TABS tablet Take by mouth.     fluticasone (FLONASE) 50 MCG/ACT nasal spray SPRAY TWO SPRAYS IN EACH NOSTRIL ONCE DAILY     methylPREDNISolone (MEDROL DOSEPAK) 4 MG TBPK tablet SMARTSIG:- Tablet(s) By Mouth -     Multiple Vitamin (MULTI-VITAMIN) tablet Take 1 tablet by mouth daily.     pseudoephedrine (SUDAFED SINUS CONGESTION 12HR) 120 MG 12 hr tablet Take by mouth.     No current facility-administered medications for this visit.    Patient confirms/reports the following allergies:  Allergies[1]  No orders of the defined types were placed in this encounter.   AUTHORIZATION  INFORMATION Primary Insurance: 1D#: Group #:  Secondary Insurance: 1D#: Group #:  SCHEDULE INFORMATION: Date: 10/21/24 Time: Location: [facility]    [1]  Allergies Allergen Reactions   Ciprofloxacin  Hcl    Clindamycin Hives   Codeine

## 2024-10-21 ENCOUNTER — Ambulatory Visit: Admit: 2024-10-21 | Admitting: Gastroenterology

## 2024-10-21 SURGERY — COLONOSCOPY
Anesthesia: General
